# Patient Record
Sex: Female | Born: 1975 | Race: Black or African American | Hispanic: No | Marital: Single | Smoking: Current some day smoker
Health system: Southern US, Community
[De-identification: ages and names within clinical notes are randomized; demographics above are authoritative.]

## PROBLEM LIST (undated history)

## (undated) DIAGNOSIS — Z789 Other specified health status: Secondary | ICD-10-CM

---

## 2005-11-13 ENCOUNTER — Emergency Department (HOSPITAL_COMMUNITY): Admission: EM | Admit: 2005-11-13 | Discharge: 2005-11-14 | Payer: Self-pay | Admitting: Emergency Medicine

## 2006-10-22 ENCOUNTER — Emergency Department (HOSPITAL_COMMUNITY): Admission: EM | Admit: 2006-10-22 | Discharge: 2006-10-22 | Payer: Self-pay | Admitting: Emergency Medicine

## 2007-01-14 ENCOUNTER — Emergency Department (HOSPITAL_COMMUNITY): Admission: EM | Admit: 2007-01-14 | Discharge: 2007-01-14 | Payer: Self-pay | Admitting: Family Medicine

## 2008-09-13 ENCOUNTER — Emergency Department (HOSPITAL_COMMUNITY): Admission: EM | Admit: 2008-09-13 | Discharge: 2008-09-13 | Payer: Self-pay | Admitting: *Deleted

## 2008-09-15 ENCOUNTER — Inpatient Hospital Stay (HOSPITAL_COMMUNITY): Admission: AD | Admit: 2008-09-15 | Discharge: 2008-09-15 | Payer: Self-pay | Admitting: Obstetrics & Gynecology

## 2008-09-17 ENCOUNTER — Inpatient Hospital Stay (HOSPITAL_COMMUNITY): Admission: AD | Admit: 2008-09-17 | Discharge: 2008-09-17 | Payer: Self-pay | Admitting: Obstetrics & Gynecology

## 2008-09-24 ENCOUNTER — Inpatient Hospital Stay (HOSPITAL_COMMUNITY): Admission: AD | Admit: 2008-09-24 | Discharge: 2008-09-24 | Payer: Self-pay | Admitting: Obstetrics & Gynecology

## 2008-10-01 ENCOUNTER — Inpatient Hospital Stay (HOSPITAL_COMMUNITY): Admission: AD | Admit: 2008-10-01 | Discharge: 2008-10-01 | Payer: Self-pay | Admitting: Obstetrics & Gynecology

## 2008-10-11 ENCOUNTER — Emergency Department (HOSPITAL_COMMUNITY): Admission: EM | Admit: 2008-10-11 | Discharge: 2008-10-11 | Payer: Self-pay | Admitting: Emergency Medicine

## 2008-10-12 ENCOUNTER — Inpatient Hospital Stay (HOSPITAL_COMMUNITY): Admission: AD | Admit: 2008-10-12 | Discharge: 2008-10-12 | Payer: Self-pay | Admitting: Obstetrics & Gynecology

## 2008-11-08 ENCOUNTER — Inpatient Hospital Stay (HOSPITAL_COMMUNITY): Admission: AD | Admit: 2008-11-08 | Discharge: 2008-11-08 | Payer: Self-pay | Admitting: Obstetrics & Gynecology

## 2009-08-20 ENCOUNTER — Emergency Department (HOSPITAL_COMMUNITY): Admission: EM | Admit: 2009-08-20 | Discharge: 2009-08-21 | Payer: Self-pay | Admitting: Emergency Medicine

## 2010-12-18 LAB — WET PREP, GENITAL
Clue Cells Wet Prep HPF POC: NONE SEEN
WBC, Wet Prep HPF POC: NONE SEEN

## 2010-12-18 LAB — CBC
HCT: 37.8 % (ref 36.0–46.0)
Hemoglobin: 12.6 g/dL (ref 12.0–15.0)
MCHC: 33.5 g/dL (ref 30.0–36.0)
MCV: 88.7 fL (ref 78.0–100.0)
RBC: 4.26 MIL/uL (ref 3.87–5.11)

## 2010-12-18 LAB — POCT PREGNANCY, URINE: Preg Test, Ur: NEGATIVE

## 2010-12-18 LAB — URINALYSIS, ROUTINE W REFLEX MICROSCOPIC
Bilirubin Urine: NEGATIVE
Ketones, ur: NEGATIVE mg/dL
Nitrite: NEGATIVE
Protein, ur: NEGATIVE mg/dL
Urobilinogen, UA: 1 mg/dL (ref 0.0–1.0)

## 2010-12-18 LAB — GC/CHLAMYDIA PROBE AMP, GENITAL: GC Probe Amp, Genital: NEGATIVE

## 2010-12-18 LAB — URINE MICROSCOPIC-ADD ON

## 2010-12-27 ENCOUNTER — Emergency Department (HOSPITAL_COMMUNITY): Payer: Self-pay

## 2010-12-27 ENCOUNTER — Emergency Department (HOSPITAL_COMMUNITY)
Admission: EM | Admit: 2010-12-27 | Discharge: 2010-12-27 | Disposition: A | Discharge status: Home / Self Care | Payer: Self-pay | Attending: Emergency Medicine | Admitting: Emergency Medicine

## 2010-12-27 DIAGNOSIS — A499 Bacterial infection, unspecified: Secondary | ICD-10-CM | POA: Insufficient documentation

## 2010-12-27 DIAGNOSIS — R112 Nausea with vomiting, unspecified: Secondary | ICD-10-CM | POA: Insufficient documentation

## 2010-12-27 DIAGNOSIS — N390 Urinary tract infection, site not specified: Secondary | ICD-10-CM | POA: Insufficient documentation

## 2010-12-27 DIAGNOSIS — N760 Acute vaginitis: Secondary | ICD-10-CM | POA: Insufficient documentation

## 2010-12-27 DIAGNOSIS — B9689 Other specified bacterial agents as the cause of diseases classified elsewhere: Secondary | ICD-10-CM | POA: Insufficient documentation

## 2010-12-27 DIAGNOSIS — K5900 Constipation, unspecified: Secondary | ICD-10-CM | POA: Insufficient documentation

## 2010-12-27 DIAGNOSIS — M549 Dorsalgia, unspecified: Secondary | ICD-10-CM | POA: Insufficient documentation

## 2010-12-27 DIAGNOSIS — R509 Fever, unspecified: Secondary | ICD-10-CM | POA: Insufficient documentation

## 2010-12-27 DIAGNOSIS — R109 Unspecified abdominal pain: Secondary | ICD-10-CM | POA: Insufficient documentation

## 2010-12-27 DIAGNOSIS — R350 Frequency of micturition: Secondary | ICD-10-CM | POA: Insufficient documentation

## 2010-12-27 LAB — URINALYSIS, ROUTINE W REFLEX MICROSCOPIC
Protein, ur: 30 mg/dL — AB
Urobilinogen, UA: 0.2 mg/dL (ref 0.0–1.0)

## 2010-12-27 LAB — DIFFERENTIAL
Basophils Absolute: 0 10*3/uL (ref 0.0–0.1)
Eosinophils Absolute: 0.1 10*3/uL (ref 0.0–0.7)
Eosinophils Relative: 2 % (ref 0–5)
Lymphocytes Relative: 21 % (ref 12–46)
Neutrophils Relative %: 68 % (ref 43–77)

## 2010-12-27 LAB — POCT I-STAT, CHEM 8
Glucose, Bld: 88 mg/dL (ref 70–99)
HCT: 39 % (ref 36.0–46.0)
Hemoglobin: 13.3 g/dL (ref 12.0–15.0)
Potassium: 4.2 mEq/L (ref 3.5–5.1)

## 2010-12-27 LAB — CBC
HCT: 37.8 % (ref 36.0–46.0)
Platelets: 220 10*3/uL (ref 150–400)
RBC: 4.34 MIL/uL (ref 3.87–5.11)
RDW: 13 % (ref 11.5–15.5)
WBC: 6.6 10*3/uL (ref 4.0–10.5)

## 2010-12-27 LAB — URINE MICROSCOPIC-ADD ON

## 2010-12-27 LAB — WET PREP, GENITAL

## 2010-12-28 LAB — GC/CHLAMYDIA PROBE AMP, GENITAL
Chlamydia, DNA Probe: NEGATIVE
GC Probe Amp, Genital: NEGATIVE

## 2010-12-30 LAB — URINE CULTURE: Colony Count: >=100000

## 2010-12-31 LAB — POCT I-STAT, CHEM 8
Calcium, Ion: 1.19 mmol/L (ref 1.12–1.32)
Creatinine, Ser: 0.8 mg/dL (ref 0.4–1.2)
Glucose, Bld: 93 mg/dL (ref 70–99)
HCT: 39 % (ref 36.0–46.0)
Hemoglobin: 13.3 g/dL (ref 12.0–15.0)
Potassium: 4 mEq/L (ref 3.5–5.1)
TCO2: 23 mmol/L (ref 0–100)

## 2010-12-31 LAB — HCG, QUANTITATIVE, PREGNANCY
hCG, Beta Chain, Quant, S: 206 m[IU]/mL — ABNORMAL HIGH (ref <5)
hCG, Beta Chain, Quant, S: 60 m[IU]/mL — ABNORMAL HIGH (ref <5)
hCG, Beta Chain, Quant, S: 13 m[IU]/mL — ABNORMAL HIGH (ref <5)
hCG, Beta Chain, Quant, S: 34 m[IU]/mL — ABNORMAL HIGH (ref <5)

## 2011-06-21 LAB — URINALYSIS, ROUTINE W REFLEX MICROSCOPIC
Glucose, UA: NEGATIVE mg/dL
Ketones, ur: NEGATIVE mg/dL
Nitrite: NEGATIVE
Specific Gravity, Urine: 1.04 — ABNORMAL HIGH (ref 1.005–1.030)
pH: 6 (ref 5.0–8.0)

## 2011-06-21 LAB — COMPREHENSIVE METABOLIC PANEL
ALT: 13 U/L (ref 0–35)
AST: 16 U/L (ref 0–37)
Calcium: 9 mg/dL (ref 8.4–10.5)
Creatinine, Ser: 0.79 mg/dL (ref 0.4–1.2)
GFR calc Af Amer: >60 mL/min (ref >60)
Sodium: 138 mEq/L (ref 135–145)
Total Protein: 7.3 g/dL (ref 6.0–8.3)

## 2011-06-21 LAB — CBC
MCHC: 32.1 g/dL (ref 30.0–36.0)
MCV: 89 fL (ref 78.0–100.0)
Platelets: 242 10*3/uL (ref 150–400)
RDW: 13.4 % (ref 11.5–15.5)

## 2011-06-21 LAB — URINE MICROSCOPIC-ADD ON

## 2011-06-21 LAB — DIFFERENTIAL
Eosinophils Absolute: 0.2 10*3/uL (ref 0.0–0.7)
Eosinophils Relative: 4 % (ref 0–5)
Lymphocytes Relative: 25 % (ref 12–46)
Lymphs Abs: 1.3 10*3/uL (ref 0.7–4.0)
Monocytes Relative: 8 % (ref 3–12)
Neutrophils Relative %: 63 % (ref 43–77)

## 2011-06-21 LAB — LIPASE, BLOOD: Lipase: 19 U/L (ref 11–59)

## 2011-06-21 LAB — WET PREP, GENITAL: Clue Cells Wet Prep HPF POC: NONE SEEN

## 2011-06-21 LAB — TYPE AND SCREEN
ABO/RH(D): B POS
Antibody Screen: NEGATIVE

## 2011-06-21 LAB — HCG, QUANTITATIVE, PREGNANCY: hCG, Beta Chain, Quant, S: 577 m[IU]/mL — ABNORMAL HIGH (ref <5)

## 2011-06-21 LAB — GC/CHLAMYDIA PROBE AMP, GENITAL: GC Probe Amp, Genital: NEGATIVE

## 2015-08-14 ENCOUNTER — Inpatient Hospital Stay (HOSPITAL_COMMUNITY)
Admission: AD | Admit: 2015-08-14 | Discharge: 2015-08-14 | Disposition: A | Discharge status: Home / Self Care | Payer: Managed Care, Other (non HMO) | Source: Ambulatory Visit | Attending: Family Medicine | Admitting: Family Medicine

## 2015-08-14 ENCOUNTER — Encounter (HOSPITAL_COMMUNITY): Payer: Self-pay | Admitting: *Deleted

## 2015-08-14 DIAGNOSIS — D259 Leiomyoma of uterus, unspecified: Secondary | ICD-10-CM | POA: Insufficient documentation

## 2015-08-14 DIAGNOSIS — N939 Abnormal uterine and vaginal bleeding, unspecified: Secondary | ICD-10-CM | POA: Diagnosis not present

## 2015-08-14 DIAGNOSIS — N946 Dysmenorrhea, unspecified: Secondary | ICD-10-CM | POA: Insufficient documentation

## 2015-08-14 HISTORY — DX: Other specified health status: Z78.9

## 2015-08-14 LAB — CBC
HCT: 35.6 % — ABNORMAL LOW (ref 36.0–46.0)
HEMOGLOBIN: 11.7 g/dL — AB (ref 12.0–15.0)
MCH: 28.5 pg (ref 26.0–34.0)
MCHC: 32.9 g/dL (ref 30.0–36.0)
MCV: 86.6 fL (ref 78.0–100.0)
Platelets: 266 10*3/uL (ref 150–400)
RBC: 4.11 MIL/uL (ref 3.87–5.11)
RDW: 13.9 % (ref 11.5–15.5)
WBC: 3.5 10*3/uL — AB (ref 4.0–10.5)

## 2015-08-14 LAB — URINALYSIS, ROUTINE W REFLEX MICROSCOPIC
BILIRUBIN URINE: NEGATIVE
Glucose, UA: NEGATIVE mg/dL
KETONES UR: NEGATIVE mg/dL
Leukocytes, UA: NEGATIVE
Nitrite: NEGATIVE
Protein, ur: NEGATIVE mg/dL
SPECIFIC GRAVITY, URINE: 1.025 (ref 1.005–1.030)
pH: 6 (ref 5.0–8.0)

## 2015-08-14 LAB — WET PREP, GENITAL
CLUE CELLS WET PREP: NONE SEEN
Sperm: NONE SEEN
TRICH WET PREP: NONE SEEN
YEAST WET PREP: NONE SEEN

## 2015-08-14 LAB — URINE MICROSCOPIC-ADD ON
SPERM UA: NONE SEEN
Trichomonas, UA: NONE SEEN
WBC, UA: NONE SEEN WBC/hpf (ref 0–5)

## 2015-08-14 LAB — POCT PREGNANCY, URINE: PREG TEST UR: NEGATIVE

## 2015-08-14 MED ORDER — MEGESTROL ACETATE 20 MG PO TABS
40.0000 mg | ORAL_TABLET | Freq: Two times a day (BID) | ORAL | Status: DC
Start: 2015-08-14 — End: 2015-09-04

## 2015-08-14 NOTE — Discharge Instructions (Signed)
Abnormal Uterine Bleeding Abnormal uterine bleeding means bleeding from the vagina that is not your normal menstrual period. This can be:  Bleeding or spotting between periods.  Bleeding after sex (sexual intercourse).  Bleeding that is heavier or more than normal.  Periods that last longer than usual.  Bleeding after menopause. There are many problems that may cause this. Treatment will depend on the cause of the bleeding. Any kind of bleeding that is not normal should be reviewed by your doctor.  HOME CARE Watch your condition for any changes. These actions may lessen any discomfort you are having:  Do not use tampons or douches as told by your doctor.  Change your pads often. You should get regular pelvic exams and Pap tests. Keep all appointments for tests as told by your doctor. GET HELP IF:  You are bleeding for more than 1 week.  You feel dizzy at times. GET HELP RIGHT AWAY IF:   You pass out.  You have to change pads every 15 to 30 minutes.  You have belly pain.  You have a fever.  You become sweaty or weak.  You are passing large blood clots from the vagina.  You feel sick to your stomach (nauseous) and throw up (vomit). MAKE SURE YOU:  Understand these instructions.  Will watch your condition.  Will get help right away if you are not doing well or get worse.   This information is not intended to replace advice given to you by your health care provider. Make sure you discuss any questions you have with your health care provider.   Document Released: 06/30/2009 Document Revised: 09/07/2013 Document Reviewed: 04/01/2013 Elsevier Interactive Patient Education 2016 Elsevier Inc. Uterine Fibroids Uterine fibroids are tissue masses (tumors). They are also called leiomyomas. They can develop inside of a woman's womb (uterus). They can grow very large. Fibroids are not cancerous (benign). Most fibroids do not require medical treatment. HOME CARE  Keep all  follow-up visits as told by your doctor. This is important.  Take medicines only as told by your doctor.  If you were prescribed a hormone treatment, take the hormone medicines exactly as told.  Do not take aspirin. It can cause bleeding.  Ask your doctor about taking iron pills and increasing the amount of dark green, leafy vegetables in your diet. These actions can help to boost your blood iron levels.  Pay close attention to your period. Tell your doctor about any changes, such as:  Increased blood flow. This may require you to use more pads or tampons than usual per month.  A change in the number of days that your period lasts per month.  A change in symptoms that come with your period, such as back pain or cramping in your belly area (abdomen). GET HELP IF:  You have pain in your back or the area between your hip bones (pelvic area) that is not controlled by medicines.  You have pain in your abdomen that is not controlled with medicines.  You have an increase in bleeding between and during periods.  You soak tampons or pads in a half hour or less.  You feel lightheaded.  You feel extra tired.  You feel weak. GET HELP RIGHT AWAY IF:   You pass out (faint).  You have a sudden increase in pelvic pain.   This information is not intended to replace advice given to you by your health care provider. Make sure you discuss any questions you have with your health  care provider.   Document Released: 10/05/2010 Document Revised: 09/23/2014 Document Reviewed: 03/01/2014 Elsevier Interactive Patient Education [employer].

## 2015-08-14 NOTE — MAU Provider Note (Signed)
History     CSN: [id number]  Arrival date and time: 08/14/15 [id number]   First Provider Initiated Contact with Patient 08/14/15 [id number]      Chief Complaint  Patient presents with  . Vaginal Bleeding  . Abdominal Pain   HPI  [name] is a 39 y.o. [id number] who presents to MAU today with complaint of vaginal bleeding. She states a history of irregular periods. She was diagnosed with fibroids ~ 2 years ago and has not seen a GYN since then. She states that periods are often prolonged, heavy and painful. She rates pain at 4/10 now. She states that Motrin helps relieve her pain. She denies weakness or dizziness. Her bleeding with this episode started on 07/28/15.   OB History    Gravida Para Term Preterm AB TAB SAB Ectopic Multiple Living   2    1  1   1       Past Medical History  Diagnosis Date  . Medical history non-contributory     Past Surgical History  Procedure Laterality Date  . No past surgeries      History reviewed. No pertinent family history.  Social History  Substance Use Topics  . Smoking status: Current Some Day Smoker -- 0.25 packs/day  . Smokeless tobacco: None  . Alcohol Use: No    Allergies: No Known Allergies  Prescriptions prior to admission  Medication Sig Dispense Refill Last Dose  . ibuprofen (ADVIL,MOTRIN) 200 MG tablet Take 200 mg by mouth every 6 (six) hours as needed for mild pain.   08/14/2015 at Unknown time    Review of Systems  Constitutional: Negative for fever and malaise/fatigue.  Gastrointestinal: Positive for abdominal pain. Negative for nausea, vomiting, diarrhea and constipation.  Genitourinary: Negative for dysuria, urgency and frequency.       + vaginal bleeding Neg - vaginal discharge   Physical Exam   Blood pressure 114/77, pulse 76, temperature 97.3 F (36.3 C), temperature source Oral, resp. rate 16, last menstrual period 07/28/2015.  Physical Exam  Nursing note and vitals reviewed. Constitutional: She is  oriented to person, place, and time. She appears well-developed and well-nourished. No distress.  HENT:  Head: Normocephalic and atraumatic.  Cardiovascular: Normal rate.   Respiratory: Effort normal.  GI: Soft. She exhibits no distension and no mass. There is no tenderness. There is no rebound and no guarding.  Genitourinary: Uterus is enlarged. Uterus is not tender. Cervix exhibits no motion tenderness, no discharge and no friability. Right adnexum displays no mass and no tenderness. Left adnexum displays no mass and no tenderness. There is bleeding (small amount of blood noted on exam) in the vagina. No vaginal discharge found.  Neurological: She is alert and oriented to person, place, and time.  Skin: Skin is warm and dry. No erythema.  Psychiatric: She has a normal mood and affect.    Results for orders placed or performed during the hospital encounter of 08/14/15 (from the past 24 hour(s))  Urinalysis, Routine w reflex microscopic (not at [facility])     Status: Abnormal   Collection Time: 08/14/15  9:35 AM  Result Value Ref Range   Color, Urine YELLOW YELLOW   APPearance HAZY (A) CLEAR   Specific Gravity, Urine 1.025 1.005 - 1.030   pH 6.0 5.0 - 8.0   Glucose, UA NEGATIVE NEGATIVE mg/dL   Hgb urine dipstick LARGE (A) NEGATIVE   Bilirubin Urine NEGATIVE NEGATIVE   Ketones, ur NEGATIVE NEGATIVE mg/dL   Protein, ur NEGATIVE  NEGATIVE mg/dL   Nitrite NEGATIVE NEGATIVE   Leukocytes, UA NEGATIVE NEGATIVE  Urine microscopic-add on     Status: Abnormal   Collection Time: 08/14/15  9:35 AM  Result Value Ref Range   Squamous Epithelial / LPF 0-5 (A) NONE SEEN   WBC, UA NONE SEEN 0 - 5 WBC/hpf   RBC / HPF 0-5 0 - 5 RBC/hpf   Bacteria, UA FEW (A) NONE SEEN   Sperm, UA NONE SEEN    Trichomonas, UA NONE SEEN   Pregnancy, urine POC     Status: None   Collection Time: 08/14/15  9:55 AM  Result Value Ref Range   Preg Test, Ur NEGATIVE NEGATIVE  Wet prep, genital     Status: Abnormal    Collection Time: 08/14/15 10:35 AM  Result Value Ref Range   Yeast Wet Prep HPF POC NONE SEEN NONE SEEN   Trich, Wet Prep NONE SEEN NONE SEEN   Clue Cells Wet Prep HPF POC NONE SEEN NONE SEEN   WBC, Wet Prep HPF POC FEW (A) NONE SEEN   Sperm NONE SEEN   CBC     Status: Abnormal   Collection Time: 08/14/15 10:49 AM  Result Value Ref Range   WBC 3.5 (L) 4.0 - 10.5 K/uL   RBC 4.11 3.87 - 5.11 MIL/uL   Hemoglobin 11.7 (L) 12.0 - 15.0 g/dL   HCT 35.6 (L) 36.0 - 46.0 %   MCV 86.6 78.0 - 100.0 fL   MCH 28.5 26.0 - 34.0 pg   MCHC 32.9 30.0 - 36.0 g/dL   RDW 13.9 11.5 - 15.5 %   Platelets 266 150 - 400 K/uL    MAU Course  Procedures None  MDM UPT - negative UA, wet prep, GC/Chlamydia, RPR and HIV today Patient is hemodynamically stable today. Will continue further work-up for AUB as outpatient.  Assessment and Plan  A: AUB Fibroids Dysmenorrhea  P: Discharge home Rx for Megace sent to patient's pharmacy Continue Ibuprofen PRN for pain Bleeding precautions discussed Outpatient [place] scheduled for 08/18/15 at 1:00 pm Patient advised to follow-up with [facility] for further evaluation and management. They will call with an appointment date/time Patient may return to MAU as needed or if her condition were to change or worsen  Luvenia Redden, PA-C  08/14/2015, 11:17 AM

## 2015-08-14 NOTE — MAU Note (Signed)
Pt C/O vag bleeding since 11/11, also having abd pain, mostly RLQ.    Pt dx'd with fibroids 2 years ago, painful periods.

## 2015-08-15 LAB — GC/CHLAMYDIA PROBE AMP (~~LOC~~) NOT AT ARMC
Chlamydia: NEGATIVE
Neisseria Gonorrhea: NEGATIVE

## 2015-08-15 LAB — RPR: RPR Ser Ql: NONREACTIVE

## 2015-08-15 LAB — HIV ANTIBODY (ROUTINE TESTING W REFLEX): HIV Screen 4th Generation wRfx: NONREACTIVE

## 2015-08-18 ENCOUNTER — Ambulatory Visit (HOSPITAL_COMMUNITY)
Admission: RE | Admit: 2015-08-18 | Discharge: 2015-08-18 | Disposition: A | Discharge status: Home / Self Care | Payer: Managed Care, Other (non HMO) | Source: Ambulatory Visit | Attending: Medical | Admitting: Medical

## 2015-08-18 DIAGNOSIS — N939 Abnormal uterine and vaginal bleeding, unspecified: Secondary | ICD-10-CM

## 2015-09-04 ENCOUNTER — Encounter: Payer: Self-pay | Admitting: Obstetrics & Gynecology

## 2015-09-04 ENCOUNTER — Ambulatory Visit (INDEPENDENT_AMBULATORY_CARE_PROVIDER_SITE_OTHER): Payer: Managed Care, Other (non HMO) | Admitting: Obstetrics & Gynecology

## 2015-09-04 VITALS — BP 112/63 | HR 75 | Temp 98.9°F | Ht 64.0 in | Wt 144.0 lb

## 2015-09-04 DIAGNOSIS — D219 Benign neoplasm of connective and other soft tissue, unspecified: Secondary | ICD-10-CM | POA: Insufficient documentation

## 2015-09-04 DIAGNOSIS — D259 Leiomyoma of uterus, unspecified: Secondary | ICD-10-CM | POA: Diagnosis not present

## 2015-09-04 DIAGNOSIS — N939 Abnormal uterine and vaginal bleeding, unspecified: Secondary | ICD-10-CM | POA: Diagnosis not present

## 2015-09-04 LAB — TSH: TSH: 1.726 u[IU]/mL (ref 0.350–4.500)

## 2015-09-04 MED ORDER — MEGESTROL ACETATE 40 MG PO TABS: 40.0000 mg | ORAL_TABLET | Freq: Two times a day (BID) | ORAL | Status: DC

## 2015-09-04 MED ORDER — IBUPROFEN 800 MG PO TABS: 800.0000 mg | ORAL_TABLET | Freq: Three times a day (TID) | ORAL | Status: DC | PRN

## 2015-09-04 NOTE — Progress Notes (Signed)
CLINIC ENCOUNTER NOTE  History:  39 y.o. [id number] here today for follow up for fibroids and AUB. AUB controlled on Megace, taking Ibuprofen 200 mg po bid for pain which does not help much.  Patient desires surgical management of her fibroids.  She denies any abnormal vaginal discharge, bleeding or other concerns.   Past Medical History  Diagnosis Date  . Medical history non-contributory     Past Surgical History  Procedure Laterality Date  . No past surgeries      The following portions of the patient's history were reviewed and updated as appropriate: allergies, current medications, past family history, past medical history, past social history, past surgical history and problem list.   Health Maintenance:  Normal pap three years ago in [place].   Review of Systems:  Pertinent items noted in HPI and remainder of comprehensive ROS otherwise negative.  Objective:  Physical Exam BP 112/63 mmHg  Pulse 75  Temp(Src) 98.9 F (37.2 C) (Oral)  Ht 5\' 4"  (1.626 m)  Wt 144 lb (65.318 kg)  BMI 24.71 kg/m2  LMP 08/27/2015 CONSTITUTIONAL: Well-developed, well-nourished female in no acute distress.  HENT:  Normocephalic, atraumatic. External right and left ear normal. Oropharynx is clear and moist EYES: Conjunctivae and EOM are normal. Pupils are equal, round, and reactive to light. No scleral icterus.  NECK: Normal range of motion, supple, no masses SKIN: Skin is warm and dry. No rash noted. Not diaphoretic. No erythema. No pallor. [place]: Alert and oriented to person, place, and time. Normal reflexes, muscle tone coordination. No cranial nerve deficit noted. PSYCHIATRIC: Normal mood and affect. Normal behavior. Normal judgment and thought content. CARDIOVASCULAR: Normal heart rate noted RESPIRATORY: Effort and breath sounds normal, no problems with respiration noted ABDOMEN: Soft, no distention noted.  16 week size uterus palpated. PELVIC: Deferred MUSCULOSKELETAL: Normal range of  motion. No edema noted.  Labs and Imaging US Transvaginal Non-ob  08/18/2015  CLINICAL DATA:  Abnormal uterine bleeding. Bleeding with pelvic pain right greater than left since 07/28/2015. Gravida 2 para 1 SAB 1. LMP 07/28/2015. EXAM: TRANSABDOMINAL AND TRANSVAGINAL ULTRASOUND OF PELVIS TECHNIQUE: Both transabdominal and transvaginal ultrasound examinations of the pelvis were performed. Transabdominal technique was performed for global imaging of the pelvis including uterus, ovaries, adnexal regions, and pelvic cul-de-sac. It was necessary to proceed with endovaginal exam following the transabdominal exam to visualize the endometrium and ovaries. COMPARISON:  MRI 05/27/2012 FINDINGS: Uterus Measurements: At least 13.5 x 6.9 x 10.6 cm. Uterus is enlarged by multiple uterine fibroids are present. Left fundal fibroid is 5.3 x 4.8 x 5.8 cm. Fundal fibroid is 2.1 x 2.2 x 2.3 cm. Right fundal fibroid is 3.0 x 2.8 x 3.2 cm. Central fibroid is 1.7 x 1.3 x 2.0 cm. Central fibroid is 1.9 x 1.9 x 2.1 cm. Central posterior fibroid is 2.3 x 1.7 x 2.2 cm. Possible submucosal component associated with this fibroid. Endometrium Thickness: 9.2 mm. Difficult to visualize given the presence of multiple fibroids. The contour of the endometrium is distorted by fibroids. Right ovary Measurements: 3.7 x 2.4 x 4.3 cm. Normal appearance/no adnexal mass. Left ovary Measurements: 5.1 x 2.0 x 5.0 cm. Para ovarian cyst is 3.0 cm. Other findings Trace free pelvic fluid. IMPRESSION: 1. Multiple uterine fibroids may account for the patient's abnormal uterine bleeding. 2. The endometrial stripe is normal in thickness. If bleeding remains unresponsive to hormonal or medical therapy, sonohysterogram should be considered for focal lesion work-up. (Ref: Radiological Reasoning: Algorithmic Workup of Abnormal Vaginal  Bleeding with Endovaginal Sonography and Sonohysterography. AJR 2008; [id number]) 3. Normal appearance of the ovaries. Note is made of  left paraovarian cyst. Electronically Signed   By: Nolon Nations M.D.   On: 08/18/2015 14:19   US Pelvis Complete  08/18/2015  CLINICAL DATA:  Abnormal uterine bleeding. Bleeding with pelvic pain right greater than left since 07/28/2015. Gravida 2 para 1 SAB 1. LMP 07/28/2015. EXAM: TRANSABDOMINAL AND TRANSVAGINAL ULTRASOUND OF PELVIS TECHNIQUE: Both transabdominal and transvaginal ultrasound examinations of the pelvis were performed. Transabdominal technique was performed for global imaging of the pelvis including uterus, ovaries, adnexal regions, and pelvic cul-de-sac. It was necessary to proceed with endovaginal exam following the transabdominal exam to visualize the endometrium and ovaries. COMPARISON:  MRI 05/27/2012 FINDINGS: Uterus Measurements: At least 13.5 x 6.9 x 10.6 cm. Uterus is enlarged by multiple uterine fibroids are present. Left fundal fibroid is 5.3 x 4.8 x 5.8 cm. Fundal fibroid is 2.1 x 2.2 x 2.3 cm. Right fundal fibroid is 3.0 x 2.8 x 3.2 cm. Central fibroid is 1.7 x 1.3 x 2.0 cm. Central fibroid is 1.9 x 1.9 x 2.1 cm. Central posterior fibroid is 2.3 x 1.7 x 2.2 cm. Possible submucosal component associated with this fibroid. Endometrium Thickness: 9.2 mm. Difficult to visualize given the presence of multiple fibroids. The contour of the endometrium is distorted by fibroids. Right ovary Measurements: 3.7 x 2.4 x 4.3 cm. Normal appearance/no adnexal mass. Left ovary Measurements: 5.1 x 2.0 x 5.0 cm. Para ovarian cyst is 3.0 cm. Other findings Trace free pelvic fluid. IMPRESSION: 1. Multiple uterine fibroids may account for the patient's abnormal uterine bleeding. 2. The endometrial stripe is normal in thickness. If bleeding remains unresponsive to hormonal or medical therapy, sonohysterogram should be considered for focal lesion work-up. (Ref: Radiological Reasoning: Algorithmic Workup of Abnormal Vaginal Bleeding with Endovaginal Sonography and Sonohysterography. AJR 2008; [id number]) 3.  Normal appearance of the ovaries. Note is made of left paraovarian cyst. Electronically Signed   By: Nolon Nations M.D.   On: 08/18/2015 14:19    Assessment & Plan:  1. Uterine leiomyoma, unspecified location 2. Abnormal uterine bleeding (AUB) Discussed management options for abnormal uterine bleeding including tranexamic acid (Lysteda), oral progesterone (Megace), Depo Provera, Mirena IUD, endometrial ablation (Novasure/Hydrothermal Ablation), myomectomy or hysterectomy as definitive surgical management.  Discussed risks and benefits of each method.  Patient declines any medical/hormomal management or Lupron therapy, also declines any other surgery but hysterectomy.    Patient desires definitive management with hysterectomy.  I proposed doing a total abdominal hysterectomy (TAH) and prophylactic bilateral salpingectomy.  No indication for oophorectomy.  Patient agrees with this proposed surgery.  The risks of surgery were discussed in detail with the patient including but not limited to: bleeding which may require transfusion or reoperation; infection which may require antibiotics; injury to bowel, bladder, ureters or other surrounding organs; need for additional procedures including laparotomy; thromboembolic phenomenon, incisional problems and other postoperative/anesthesia complications.  Patient was also advised that she will remain in house for 1-2 nights; and expected recovery time after a hysterectomy is 6-8 weeks.  Likelihood of success in alleviating the patient's symptoms was discussed.   She was told that she will be contacted by our surgical scheduler regarding the time and date of her surgery; routine preoperative instructions of having nothing to eat or drink after midnight on the day prior to surgery and also coming to the hospital 1.5 hours prior to her time of surgery were also emphasized.  She was told she may be called for a preoperative appointment about a week prior to surgery and  will be given further preoperative instructions at that visit.  Routine postoperative instructions will be reviewed with the patient and her family in detail after surgery.  In the meantime, she will continue Megace and Ibuprofen (800 mg tid prescribed); bleeding precautions were reviewed. Printed patient education handouts about the procedure was given to the patient to review at home.  Patient will return for preoperative pap smear and endometrial biopsy.  Orders placed: - megestrol (MEGACE) 40 MG tablet; Take 1 tablet (40 mg total) by mouth 2 (two) times daily. Can increase to two tablets twice a day for heavy bleeding  Dispense: 60 tablet; Refill: 8 - ibuprofen (ADVIL,MOTRIN) 800 MG tablet; Take 1 tablet (800 mg total) by mouth 3 (three) times daily as needed for moderate pain or cramping.  Dispense: 30 tablet; Refill: 3 - TSH - Surgical orders placed  Routine preventative health maintenance measures emphasized. Please refer to After Visit Summary for other counseling recommendations.   Return in about 1 month (around 10/05/2015) for Pap smear and endometrial biopsy with Elanore Talcott.   Total face-to-face time with patient: 25 minutes. Over 50% of encounter was spent on counseling and coordination of care.   Verita Schneiders, MD, [place] Attending Obstetrician & Gynecologist, [place] for [facility]

## 2015-09-04 NOTE — Patient Instructions (Signed)
Hysterectomy Information  A hysterectomy is a surgery in which your uterus is removed. This surgery may be done to treat various medical problems. After the surgery, you will no longer have menstrual periods. The surgery will also make you unable to become pregnant (sterile). The fallopian tubes and ovaries can be removed (bilateral salpingo-oophorectomy) during this surgery as well.  REASONS FOR A HYSTERECTOMY  Persistent, abnormal bleeding.  Lasting (chronic) pelvic pain or infection.  The lining of the uterus (endometrium) starts growing outside the uterus (endometriosis).  The endometrium starts growing in the muscle of the uterus (adenomyosis).  The uterus falls down into the vagina (pelvic organ prolapse).  Noncancerous growths in the uterus (uterine fibroids) that cause symptoms.  Precancerous cells.  Cervical cancer or uterine cancer. TYPES OF HYSTERECTOMIES  Supracervical hysterectomy--In this type, the top part of the uterus is removed, but not the cervix.  Total hysterectomy--The uterus and cervix are removed.  Radical hysterectomy--The uterus, the cervix, and the fibrous tissue that holds the uterus in place in the pelvis (parametrium) are removed. WAYS A HYSTERECTOMY CAN BE PERFORMED  Abdominal hysterectomy--A large surgical cut (incision) is made in the abdomen. The uterus is removed through this incision.  Vaginal hysterectomy--An incision is made in the vagina. The uterus is removed through this incision. There are no abdominal incisions.  Conventional laparoscopic hysterectomy--Three or four small incisions are made in the abdomen. A thin, lighted tube with a camera (laparoscope) is inserted into one of the incisions. Other tools are put through the other incisions. The uterus is cut into small pieces. The small pieces are removed through the incisions, or they are removed through the vagina.  Laparoscopically assisted vaginal hysterectomy (LAVH)--Three or four  small incisions are made in the abdomen. Part of the surgery is performed laparoscopically and part vaginally. The uterus is removed through the vagina.  Robot-assisted laparoscopic hysterectomy--A laparoscope and other tools are inserted into 3 or 4 small incisions in the abdomen. A computer-controlled device is used to give the surgeon a 3D image and to help control the surgical instruments. This allows for more precise movements of surgical instruments. The uterus is cut into small pieces and removed through the incisions or removed through the vagina. RISKS AND COMPLICATIONS  Possible complications associated with this procedure include:  Bleeding and risk of blood transfusion. Tell your health care provider if you do not want to receive any blood products.  Blood clots in the legs or lung.  Infection.  Injury to surrounding organs.  Problems or side effects related to anesthesia.  Conversion to an abdominal hysterectomy from one of the other techniques. WHAT TO EXPECT AFTER A HYSTERECTOMY  You will be given pain medicine.  You will need to have someone with you for the first 3-5 days after you go home.  You will need to follow up with your surgeon in 2-4 weeks after surgery to evaluate your progress.  You may have early menopause symptoms such as hot flashes, night sweats, and insomnia.  If you had a hysterectomy for a problem that was not cancer or not a condition that could lead to cancer, then you no longer need Pap tests. However, even if you no longer need a Pap test, a regular exam is a good idea to make sure no other problems are starting.   This information is not intended to replace advice given to you by your health care provider. Make sure you discuss any questions you have with your health care   provider.   Document Released: 02/26/2001 Document Revised: 06/23/2013 Document Reviewed: 05/10/2013 Elsevier Interactive Patient Education 2016 [place].  Abdominal  Hysterectomy Abdominal hysterectomy is a surgical procedure to remove your womb (uterus). Your uterus is the muscular organ that contains a developing baby. This surgery is done for many reasons. You may need an abdominal hysterectomy if you have cancer, growths (tumors), long-term pain, or bleeding. You may also have this procedure if your uterus has slipped down into your vagina (uterine prolapse). Depending on why you need an abdominal hysterectomy, you may also have other reproductive organs removed. These could include the part of your vagina that connects with your uterus (cervix), the organs that make eggs (ovaries), and the tubes that connect the ovaries to the uterus (fallopian tubes). LET [facility] CARE PROVIDER KNOW ABOUT:   Any allergies you have.  All medicines you are taking, including vitamins, herbs, eye drops, creams, and over-the-counter medicines.  Previous problems you or members of your family have had with the use of anesthetics.  Any blood disorders you have.  Previous surgeries you have had.  Medical conditions you have. RISKS AND COMPLICATIONS Generally, this is a safe procedure. However, as with any procedure, problems can occur. Infection is the most common problem after an abdominal hysterectomy. Other possible problems include:  Bleeding.  Formation of blood clots that may break free and travel to your lungs.  Injury to other organs near your uterus.  Nerve injury causing nerve pain.  Decreased interest in sex or pain during sexual intercourse. BEFORE THE PROCEDURE  Abdominal hysterectomy is a major surgical procedure. It can affect the way you feel about yourself. Talk to your health care provider about the physical and emotional changes hysterectomy may cause.  You may need to have blood work and X-rays done before surgery.  Quit smoking if you smoke. Ask your health care provider for help if you are struggling to quit.  Stop taking medicines that  thin your blood as directed by your health care provider.  You may be instructed to take antibiotic medicines or laxatives before surgery.  Do not eat or drink anything for 6-8 hours before surgery.  Take your regular medicines with a small sip of water.  Bathe or shower the night or morning before surgery. PROCEDURE  Abdominal hysterectomy is done in the operating room at the hospital.  In most cases, you will be given a medicine that makes you go to sleep (general anesthetic).  The surgeon will make a cut (incision) through the skin in your lower belly.  The incision may be about 5-7 inches long. It may go side-to-side or up-and-down.  The surgeon will move aside the body tissue that covers your uterus. The surgeon will then carefully take out your uterus along with any of your other reproductive organs that need to be removed.  Bleeding will be controlled with clamps or sutures.  The surgeon will close your incision with sutures or metal clips. AFTER THE PROCEDURE  You will have some pain immediately after the procedure.  You will be given pain medicine in the recovery room.  You will be taken to your hospital room when you have recovered from the anesthesia.  You may need to stay in the hospital for 2-5 days.  You will be given instructions for recovery at home.   This information is not intended to replace advice given to you by your health care provider. Make sure you discuss any questions you have with  your health care provider.   Document Released: 09/07/2013 Document Reviewed: 09/07/2013 Elsevier Interactive Patient Education [employer].

## 2015-09-05 ENCOUNTER — Encounter (HOSPITAL_COMMUNITY): Payer: Self-pay | Admitting: *Deleted

## 2015-09-29 ENCOUNTER — Telehealth: Payer: Self-pay

## 2015-09-29 ENCOUNTER — Encounter: Payer: Self-pay | Admitting: Obstetrics and Gynecology

## 2015-09-29 ENCOUNTER — Ambulatory Visit (INDEPENDENT_AMBULATORY_CARE_PROVIDER_SITE_OTHER): Payer: Managed Care, Other (non HMO) | Admitting: Obstetrics and Gynecology

## 2015-09-29 ENCOUNTER — Other Ambulatory Visit (HOSPITAL_COMMUNITY)
Admission: RE | Admit: 2015-09-29 | Discharge: 2015-09-29 | Disposition: A | Discharge status: Home / Self Care | Payer: Managed Care, Other (non HMO) | Source: Ambulatory Visit | Attending: Obstetrics and Gynecology | Admitting: Obstetrics and Gynecology

## 2015-09-29 VITALS — BP 132/87 | HR 84 | Temp 98.0°F | Resp 18 | Ht 65.0 in | Wt 147.6 lb

## 2015-09-29 DIAGNOSIS — Z124 Encounter for screening for malignant neoplasm of cervix: Secondary | ICD-10-CM

## 2015-09-29 DIAGNOSIS — R87619 Unspecified abnormal cytological findings in specimens from cervix uteri: Secondary | ICD-10-CM | POA: Diagnosis present

## 2015-09-29 DIAGNOSIS — Z1239 Encounter for other screening for malignant neoplasm of breast: Secondary | ICD-10-CM

## 2015-09-29 DIAGNOSIS — Z1151 Encounter for screening for human papillomavirus (HPV): Secondary | ICD-10-CM

## 2015-09-29 DIAGNOSIS — N939 Abnormal uterine and vaginal bleeding, unspecified: Secondary | ICD-10-CM

## 2015-09-29 DIAGNOSIS — D259 Leiomyoma of uterus, unspecified: Secondary | ICD-10-CM | POA: Diagnosis not present

## 2015-09-29 NOTE — Progress Notes (Signed)
Patient ID: Selena Smith, female   DOB: 06-20-1976, 40 y.o.   MRN: [id number] 40 yo [id number] scheduled for TAH secondary to fibroid uterus and DUB who is here for endometrial biopsy and pap smear.   Blood pressure 132/87, pulse 84, temperature 98 F (36.7 C), temperature source Oral, resp. rate 18, height 5\' 5"  (1.651 m), weight 147 lb 9.6 oz (66.951 kg), last menstrual period 08/27/2015, SpO2 100 %.  GENERAL: Well-developed, well-nourished female in no acute distress.  ABDOMEN: Soft, nontender, nondistended. No organomegaly. PELVIC: Normal external female genitalia. Vagina is pink and rugated.  Normal discharge. Normal appearing cervix. Uterus is 16-week in size. No adnexal mass or tenderness. EXTREMITIES: No cyanosis, clubbing, or edema, 2+ distal pulses.  A/P 40 yo here for endometrial biopsy and pap smear in preparation for surgery - pap smear collected - ENDOMETRIAL BIOPSY     The indications for endometrial biopsy were reviewed.   Risks of the biopsy including cramping, bleeding, infection, uterine perforation, inadequate specimen and need for additional procedures  were discussed. The patient states she understands and agrees to undergo procedure today. Consent was signed. Time out was performed. Urine HCG was negative. A sterile speculum was placed in the patient's vagina and the cervix was prepped with Betadine. A single-toothed tenaculum was placed on the anterior lip of the cervix to stabilize it. The uterine cavity was sounded to a depth of 8 cm using the uterine sound. The 3 mm pipelle was introduced into the endometrial cavity without difficulty, 2 passes were made.  A  small amount of tissue was  sent to pathology. It was very difficult to thread the pipelle in the endometrial cavity, likely secondary to the presence of fibroids.  The instruments were removed from the patient's vagina. Minimal bleeding from the cervix was noted. The patient tolerated the procedure well.  Routine  post-procedure instructions were given to the patient. The patient will follow up in two weeks to review the results and for further management.  - Patient will be notified of the results - Screening mammogram also scheduled

## 2015-09-29 NOTE — Addendum Note (Signed)
Addended by: Mason Jim A on: 09/29/2015 12:00 PM   Modules accepted: Orders

## 2015-09-29 NOTE — Telephone Encounter (Signed)
Called pt to inform her of her mammogram appt on Wed Jan 5th @ 1110. Pt verbalizes understanding.

## 2015-10-02 ENCOUNTER — Encounter (HOSPITAL_COMMUNITY): Payer: Self-pay

## 2015-10-02 ENCOUNTER — Encounter (HOSPITAL_COMMUNITY)
Admission: RE | Admit: 2015-10-02 | Discharge: 2015-10-02 | Disposition: A | Discharge status: Home / Self Care | Payer: Managed Care, Other (non HMO) | Source: Ambulatory Visit | Attending: Obstetrics & Gynecology | Admitting: Obstetrics & Gynecology

## 2015-10-02 DIAGNOSIS — Z01812 Encounter for preprocedural laboratory examination: Secondary | ICD-10-CM | POA: Diagnosis present

## 2015-10-02 DIAGNOSIS — N939 Abnormal uterine and vaginal bleeding, unspecified: Secondary | ICD-10-CM | POA: Insufficient documentation

## 2015-10-02 DIAGNOSIS — D259 Leiomyoma of uterus, unspecified: Secondary | ICD-10-CM | POA: Insufficient documentation

## 2015-10-02 LAB — CBC
HCT: 36.2 % (ref 36.0–46.0)
HEMOGLOBIN: 11.8 g/dL — AB (ref 12.0–15.0)
MCH: 28.4 pg (ref 26.0–34.0)
MCHC: 32.6 g/dL (ref 30.0–36.0)
MCV: 87.2 fL (ref 78.0–100.0)
Platelets: 271 10*3/uL (ref 150–400)
RBC: 4.15 MIL/uL (ref 3.87–5.11)
RDW: 14.3 % (ref 11.5–15.5)
WBC: 4.2 10*3/uL (ref 4.0–10.5)

## 2015-10-02 LAB — TYPE AND SCREEN
ABO/RH(D): B POS
Antibody Screen: NEGATIVE

## 2015-10-02 NOTE — Patient Instructions (Signed)
Your procedure is scheduled on: 10/17/15  Enter through the Main Entrance at : 1:30pm Pick up desk phone and dial [phone] and inform us of your arrival.  Please call [phone] if you have any problems the morning of surgery.  Remember: Do not eat food after midnight:Monday Clear liquids are ok until:11am on Tuesday   You may brush your teeth the morning of surgery.  Take these meds the morning of surgery with a sip of water:none  DO NOT wear jewelry, eye make-up, lipstick,body lotion, or dark fingernail polish.  (Polished toes are ok) You may wear deodorant.  If you are to be admitted after surgery, leave suitcase in car until your room has been assigned. Patients discharged on the day of surgery will not be allowed to drive home. Wear loose fitting, comfortable clothes for your ride home.

## 2015-10-03 LAB — ABO/RH: ABO/RH(D): B POS

## 2015-10-03 LAB — CYTOLOGY - PAP

## 2015-10-06 NOTE — Telephone Encounter (Signed)
Error

## 2015-10-11 ENCOUNTER — Telehealth: Payer: Self-pay | Admitting: General Practice

## 2015-10-11 ENCOUNTER — Ambulatory Visit
Admission: RE | Admit: 2015-10-11 | Discharge: 2015-10-11 | Disposition: A | Discharge status: Home / Self Care | Payer: 59 | Source: Ambulatory Visit | Attending: Obstetrics and Gynecology | Admitting: Obstetrics and Gynecology

## 2015-10-11 DIAGNOSIS — Z1239 Encounter for other screening for malignant neoplasm of breast: Secondary | ICD-10-CM

## 2015-10-11 NOTE — Telephone Encounter (Signed)
Per Dr Elly Modena, endometrial biopsy was insufficient. Dr. Loni Muse, will proceed with the surgery as planned. Patient's pap was negative. Called patient, no answer- left message stating we are trying to reach you with non urgent results, please call us back at the clinics

## 2015-10-12 ENCOUNTER — Other Ambulatory Visit: Payer: Self-pay | Admitting: Obstetrics and Gynecology

## 2015-10-12 DIAGNOSIS — R928 Other abnormal and inconclusive findings on diagnostic imaging of breast: Secondary | ICD-10-CM

## 2015-10-12 NOTE — Telephone Encounter (Signed)
Called Selena Smith and notified her per Dr. Elly Modena endometrial biopsy was sufficient and will proceed with surgery as scheduled; pap was normal   She had no questions and voiced understanding.

## 2015-10-13 ENCOUNTER — Ambulatory Visit
Admission: RE | Admit: 2015-10-13 | Discharge: 2015-10-13 | Disposition: A | Discharge status: Home / Self Care | Payer: 59 | Source: Ambulatory Visit | Attending: Obstetrics and Gynecology | Admitting: Obstetrics and Gynecology

## 2015-10-13 DIAGNOSIS — R928 Other abnormal and inconclusive findings on diagnostic imaging of breast: Secondary | ICD-10-CM

## 2015-10-16 MED ORDER — DEXTROSE 5 % IV SOLN
2.0000 g | INTRAVENOUS | Status: AC
  Administered 2015-10-17: 2 g via INTRAVENOUS
  Filled 2015-10-16: qty 2

## 2015-10-17 ENCOUNTER — Encounter (HOSPITAL_COMMUNITY)
Admission: RE | Disposition: A | Discharge status: Home / Self Care | Payer: Self-pay | Source: Ambulatory Visit | Attending: Obstetrics & Gynecology

## 2015-10-17 ENCOUNTER — Inpatient Hospital Stay (HOSPITAL_COMMUNITY): Payer: 59 | Admitting: Anesthesiology

## 2015-10-17 ENCOUNTER — Encounter (HOSPITAL_COMMUNITY): Payer: Self-pay | Admitting: *Deleted

## 2015-10-17 ENCOUNTER — Inpatient Hospital Stay (HOSPITAL_COMMUNITY)
Admission: RE | Admit: 2015-10-17 | Discharge: 2015-10-19 | DRG: 743 | Disposition: A | Discharge status: Home / Self Care | Payer: 59 | Source: Ambulatory Visit | Attending: Obstetrics & Gynecology | Admitting: Obstetrics & Gynecology

## 2015-10-17 DIAGNOSIS — D219 Benign neoplasm of connective and other soft tissue, unspecified: Secondary | ICD-10-CM | POA: Diagnosis present

## 2015-10-17 DIAGNOSIS — Z9079 Acquired absence of other genital organ(s): Secondary | ICD-10-CM

## 2015-10-17 DIAGNOSIS — N939 Abnormal uterine and vaginal bleeding, unspecified: Secondary | ICD-10-CM | POA: Diagnosis present

## 2015-10-17 DIAGNOSIS — D259 Leiomyoma of uterus, unspecified: Secondary | ICD-10-CM | POA: Diagnosis present

## 2015-10-17 DIAGNOSIS — Z90710 Acquired absence of both cervix and uterus: Secondary | ICD-10-CM

## 2015-10-17 DIAGNOSIS — F17210 Nicotine dependence, cigarettes, uncomplicated: Secondary | ICD-10-CM | POA: Diagnosis present

## 2015-10-17 DIAGNOSIS — Z90722 Acquired absence of ovaries, bilateral: Secondary | ICD-10-CM

## 2015-10-17 HISTORY — PX: ABDOMINAL HYSTERECTOMY: SHX81

## 2015-10-17 LAB — PREGNANCY, URINE: Preg Test, Ur: NEGATIVE

## 2015-10-17 LAB — TYPE AND SCREEN
ABO/RH(D): B POS
ANTIBODY SCREEN: NEGATIVE

## 2015-10-17 SURGERY — HYSTERECTOMY, TOTAL, ABDOMINAL, WITH SALPINGECTOMY
Anesthesia: General | Site: Abdomen | Laterality: Bilateral

## 2015-10-17 MED ORDER — KETOROLAC TROMETHAMINE 30 MG/ML IJ SOLN
INTRAMUSCULAR | Status: AC
  Filled 2015-10-17: qty 1

## 2015-10-17 MED ORDER — OXYCODONE-ACETAMINOPHEN 5-325 MG PO TABS
1.0000 | ORAL_TABLET | ORAL | Status: DC | PRN
  Administered 2015-10-18: 2 via ORAL
  Administered 2015-10-18: 1 via ORAL
  Administered 2015-10-18 (×2): 2 via ORAL
  Administered 2015-10-19 (×2): 1 via ORAL
  Filled 2015-10-17: qty 2
  Filled 2015-10-17 (×2): qty 1
  Filled 2015-10-17: qty 2
  Filled 2015-10-17: qty 1
  Filled 2015-10-17: qty 2

## 2015-10-17 MED ORDER — LACTATED RINGERS IV SOLN: INTRAVENOUS | Status: DC

## 2015-10-17 MED ORDER — FENTANYL CITRATE (PF) 250 MCG/5ML IJ SOLN
INTRAMUSCULAR | Status: AC
  Filled 2015-10-17: qty 5

## 2015-10-17 MED ORDER — MIDAZOLAM HCL 2 MG/2ML IJ SOLN
INTRAMUSCULAR | Status: AC
  Filled 2015-10-17: qty 2

## 2015-10-17 MED ORDER — PNEUMOCOCCAL VAC POLYVALENT 25 MCG/0.5ML IJ INJ
0.5000 mL | INJECTION | INTRAMUSCULAR | Status: AC
  Administered 2015-10-18: 0.5 mL via INTRAMUSCULAR
  Filled 2015-10-17: qty 0.5

## 2015-10-17 MED ORDER — LIDOCAINE HCL (CARDIAC) 20 MG/ML IV SOLN
INTRAVENOUS | Status: DC | PRN
  Administered 2015-10-17: 60 mg via INTRAVENOUS

## 2015-10-17 MED ORDER — DOCUSATE SODIUM 100 MG PO CAPS
100.0000 mg | ORAL_CAPSULE | Freq: Two times a day (BID) | ORAL | Status: DC
  Administered 2015-10-17 – 2015-10-19 (×4): 100 mg via ORAL
  Filled 2015-10-17 (×4): qty 1

## 2015-10-17 MED ORDER — MEPERIDINE HCL 25 MG/ML IJ SOLN: 6.2500 mg | INTRAMUSCULAR | Status: DC | PRN

## 2015-10-17 MED ORDER — ONDANSETRON HCL 4 MG PO TABS: 4.0000 mg | ORAL_TABLET | Freq: Four times a day (QID) | ORAL | Status: DC | PRN

## 2015-10-17 MED ORDER — ALUM & MAG HYDROXIDE-SIMETH 200-200-20 MG/5ML PO SUSP: 30.0000 mL | ORAL | Status: DC | PRN

## 2015-10-17 MED ORDER — HYDROMORPHONE HCL 1 MG/ML IJ SOLN
0.2500 mg | INTRAMUSCULAR | Status: DC | PRN
  Administered 2015-10-17 (×4): 0.5 mg via INTRAVENOUS

## 2015-10-17 MED ORDER — PROPOFOL 10 MG/ML IV BOLUS
INTRAVENOUS | Status: DC | PRN
  Administered 2015-10-17: 200 mg via INTRAVENOUS

## 2015-10-17 MED ORDER — 0.9 % SODIUM CHLORIDE (POUR BTL) OPTIME
TOPICAL | Status: DC | PRN
  Administered 2015-10-17: 1000 mL

## 2015-10-17 MED ORDER — KETOROLAC TROMETHAMINE 30 MG/ML IJ SOLN
30.0000 mg | Freq: Once | INTRAMUSCULAR | Status: AC
  Administered 2015-10-17: 30 mg via INTRAVENOUS
  Filled 2015-10-17: qty 1

## 2015-10-17 MED ORDER — SODIUM CHLORIDE 0.9% FLUSH: 9.0000 mL | INTRAVENOUS | Status: DC | PRN

## 2015-10-17 MED ORDER — ONDANSETRON HCL 4 MG/2ML IJ SOLN
INTRAMUSCULAR | Status: AC
  Filled 2015-10-17: qty 2

## 2015-10-17 MED ORDER — MENTHOL 3 MG MT LOZG: 1.0000 | LOZENGE | OROMUCOSAL | Status: DC | PRN

## 2015-10-17 MED ORDER — HYDROMORPHONE HCL 1 MG/ML IJ SOLN
INTRAMUSCULAR | Status: AC
  Filled 2015-10-17: qty 1

## 2015-10-17 MED ORDER — SCOPOLAMINE 1 MG/3DAYS TD PT72
1.0000 | MEDICATED_PATCH | TRANSDERMAL | Status: DC
  Administered 2015-10-17: 1.5 mg via TRANSDERMAL

## 2015-10-17 MED ORDER — HYDROMORPHONE 1 MG/ML IV SOLN
INTRAVENOUS | Status: DC
  Administered 2015-10-17: 18:00:00 via INTRAVENOUS
  Administered 2015-10-17: 3.6 mg via INTRAVENOUS
  Administered 2015-10-18: 2.7 mg via INTRAVENOUS
  Filled 2015-10-17: qty 25

## 2015-10-17 MED ORDER — ROCURONIUM BROMIDE 100 MG/10ML IV SOLN
INTRAVENOUS | Status: DC | PRN
  Administered 2015-10-17 (×2): 10 mg via INTRAVENOUS
  Administered 2015-10-17: 40 mg via INTRAVENOUS

## 2015-10-17 MED ORDER — BUPIVACAINE HCL (PF) 0.5 % IJ SOLN
INTRAMUSCULAR | Status: AC
  Filled 2015-10-17: qty 30

## 2015-10-17 MED ORDER — ONDANSETRON HCL 4 MG/2ML IJ SOLN: 4.0000 mg | Freq: Four times a day (QID) | INTRAMUSCULAR | Status: DC | PRN

## 2015-10-17 MED ORDER — NALOXONE HCL 0.4 MG/ML IJ SOLN: 0.4000 mg | INTRAMUSCULAR | Status: DC | PRN

## 2015-10-17 MED ORDER — MIDAZOLAM HCL 2 MG/2ML IJ SOLN
INTRAMUSCULAR | Status: DC | PRN
Start: 2015-10-17 — End: 2015-10-17
  Administered 2015-10-17: 2 mg via INTRAVENOUS

## 2015-10-17 MED ORDER — KETOROLAC TROMETHAMINE 30 MG/ML IJ SOLN
INTRAMUSCULAR | Status: DC | PRN
  Administered 2015-10-17: 30 mg via INTRAVENOUS

## 2015-10-17 MED ORDER — HYDROMORPHONE HCL 1 MG/ML IJ SOLN: 0.2000 mg | INTRAMUSCULAR | Status: DC | PRN

## 2015-10-17 MED ORDER — DEXAMETHASONE SODIUM PHOSPHATE 4 MG/ML IJ SOLN
INTRAMUSCULAR | Status: AC
  Filled 2015-10-17: qty 1

## 2015-10-17 MED ORDER — BUPIVACAINE HCL (PF) 0.5 % IJ SOLN
INTRAMUSCULAR | Status: DC | PRN
  Administered 2015-10-17: 20 mL

## 2015-10-17 MED ORDER — SIMETHICONE 80 MG PO CHEW: 80.0000 mg | CHEWABLE_TABLET | Freq: Four times a day (QID) | ORAL | Status: DC | PRN

## 2015-10-17 MED ORDER — INFLUENZA VAC SPLIT QUAD 0.5 ML IM SUSY
0.5000 mL | PREFILLED_SYRINGE | INTRAMUSCULAR | Status: AC
  Administered 2015-10-19: 0.5 mL via INTRAMUSCULAR
  Filled 2015-10-17: qty 0.5

## 2015-10-17 MED ORDER — ONDANSETRON HCL 4 MG/2ML IJ SOLN
INTRAMUSCULAR | Status: DC | PRN
  Administered 2015-10-17: 4 mg via INTRAVENOUS

## 2015-10-17 MED ORDER — CEFAZOLIN SODIUM-DEXTROSE 2-3 GM-% IV SOLR
INTRAVENOUS | Status: AC
  Filled 2015-10-17: qty 50

## 2015-10-17 MED ORDER — GLYCOPYRROLATE 0.2 MG/ML IJ SOLN
INTRAMUSCULAR | Status: DC | PRN
  Administered 2015-10-17: 0.4 mg via INTRAVENOUS

## 2015-10-17 MED ORDER — DIPHENHYDRAMINE HCL 12.5 MG/5ML PO ELIX: 12.5000 mg | ORAL_SOLUTION | Freq: Four times a day (QID) | ORAL | Status: DC | PRN

## 2015-10-17 MED ORDER — SENNOSIDES-DOCUSATE SODIUM 8.6-50 MG PO TABS
1.0000 | ORAL_TABLET | Freq: Every evening | ORAL | Status: DC | PRN
Start: 2015-10-17 — End: 2015-10-19

## 2015-10-17 MED ORDER — HYDROMORPHONE HCL 1 MG/ML IJ SOLN
INTRAMUSCULAR | Status: AC
  Administered 2015-10-17: 0.5 mg via INTRAVENOUS
  Filled 2015-10-17: qty 1

## 2015-10-17 MED ORDER — DIPHENHYDRAMINE HCL 50 MG/ML IJ SOLN: 12.5000 mg | Freq: Four times a day (QID) | INTRAMUSCULAR | Status: DC | PRN

## 2015-10-17 MED ORDER — GLYCOPYRROLATE 0.2 MG/ML IJ SOLN
INTRAMUSCULAR | Status: AC
  Filled 2015-10-17: qty 2

## 2015-10-17 MED ORDER — PROPOFOL 10 MG/ML IV BOLUS
INTRAVENOUS | Status: AC
  Filled 2015-10-17: qty 20

## 2015-10-17 MED ORDER — FENTANYL CITRATE (PF) 100 MCG/2ML IJ SOLN
INTRAMUSCULAR | Status: DC | PRN
  Administered 2015-10-17 (×3): 50 ug via INTRAVENOUS
  Administered 2015-10-17 (×2): 100 ug via INTRAVENOUS

## 2015-10-17 MED ORDER — SCOPOLAMINE 1 MG/3DAYS TD PT72
MEDICATED_PATCH | TRANSDERMAL | Status: AC
  Administered 2015-10-17: 1.5 mg via TRANSDERMAL
  Filled 2015-10-17: qty 1

## 2015-10-17 MED ORDER — LACTATED RINGERS IV SOLN
INTRAVENOUS | Status: DC
  Administered 2015-10-17: 14:00:00 via INTRAVENOUS

## 2015-10-17 MED ORDER — LIDOCAINE HCL (CARDIAC) 20 MG/ML IV SOLN
INTRAVENOUS | Status: AC
  Filled 2015-10-17: qty 5

## 2015-10-17 MED ORDER — FENTANYL CITRATE (PF) 100 MCG/2ML IJ SOLN
INTRAMUSCULAR | Status: AC
Start: 2015-10-17 — End: 2015-10-17
  Filled 2015-10-17: qty 2

## 2015-10-17 MED ORDER — NEOSTIGMINE METHYLSULFATE 10 MG/10ML IV SOLN
INTRAVENOUS | Status: DC | PRN
  Administered 2015-10-17: 2 mg via INTRAVENOUS

## 2015-10-17 MED ORDER — DEXAMETHASONE SODIUM PHOSPHATE 10 MG/ML IJ SOLN
INTRAMUSCULAR | Status: DC | PRN
  Administered 2015-10-17: 4 mg via INTRAVENOUS

## 2015-10-17 MED ORDER — MAGNESIUM CITRATE PO SOLN
1.0000 | Freq: Once | ORAL | Status: DC | PRN
  Filled 2015-10-17: qty 296

## 2015-10-17 MED ORDER — OXYCODONE HCL 5 MG PO TABS: 5.0000 mg | ORAL_TABLET | Freq: Once | ORAL | Status: DC | PRN

## 2015-10-17 MED ORDER — PANTOPRAZOLE SODIUM 40 MG PO TBEC
40.0000 mg | DELAYED_RELEASE_TABLET | Freq: Every day | ORAL | Status: DC
  Administered 2015-10-18 – 2015-10-19 (×2): 40 mg via ORAL
  Filled 2015-10-17 (×2): qty 1

## 2015-10-17 MED ORDER — OXYCODONE HCL 5 MG/5ML PO SOLN: 5.0000 mg | Freq: Once | ORAL | Status: DC | PRN

## 2015-10-17 MED ORDER — NEOSTIGMINE METHYLSULFATE 10 MG/10ML IV SOLN
INTRAVENOUS | Status: AC
  Filled 2015-10-17: qty 1

## 2015-10-17 MED ORDER — HYDROMORPHONE HCL 1 MG/ML IJ SOLN
INTRAMUSCULAR | Status: DC | PRN
  Administered 2015-10-17 (×2): 0.5 mg via INTRAVENOUS

## 2015-10-17 MED ORDER — IBUPROFEN 600 MG PO TABS
600.0000 mg | ORAL_TABLET | Freq: Four times a day (QID) | ORAL | Status: DC | PRN
  Administered 2015-10-18 – 2015-10-19 (×3): 600 mg via ORAL
  Filled 2015-10-17 (×3): qty 1

## 2015-10-17 MED ORDER — LACTATED RINGERS IV SOLN
INTRAVENOUS | Status: DC
  Administered 2015-10-17 (×2): via INTRAVENOUS

## 2015-10-17 MED ORDER — BISACODYL 10 MG RE SUPP: 10.0000 mg | Freq: Every day | RECTAL | Status: DC | PRN

## 2015-10-17 MED ORDER — ZOLPIDEM TARTRATE 5 MG PO TABS: 5.0000 mg | ORAL_TABLET | Freq: Every evening | ORAL | Status: DC | PRN

## 2015-10-17 SURGICAL SUPPLY — 48 items
APL SKNCLS STERI-STRIP NONHPOA (GAUZE/BANDAGES/DRESSINGS)
BARRIER ADHS 3X4 INTERCEED (GAUZE/BANDAGES/DRESSINGS) IMPLANT
BENZOIN TINCTURE PRP APPL 2/3 (GAUZE/BANDAGES/DRESSINGS) IMPLANT
BRR ADH 4X3 ABS CNTRL BYND (GAUZE/BANDAGES/DRESSINGS)
CANISTER SUCT 3000ML (MISCELLANEOUS) ×3 IMPLANT
CELLS DAT CNTRL 66122 CELL SVR (MISCELLANEOUS) IMPLANT
CLOSURE WOUND 1/2 X4 (GAUZE/BANDAGES/DRESSINGS) ×1
CLOTH BEACON ORANGE TIMEOUT ST (SAFETY) ×3 IMPLANT
CONT PATH 16OZ SNAP LID 3702 (MISCELLANEOUS) ×3 IMPLANT
DECANTER SPIKE VIAL GLASS SM (MISCELLANEOUS) ×2 IMPLANT
DRAPE WARM FLUID 44X44 (DRAPE) ×2 IMPLANT
DRSG OPSITE POSTOP 4X10 (GAUZE/BANDAGES/DRESSINGS) ×3 IMPLANT
DURAPREP 26ML APPLICATOR (WOUND CARE) ×3 IMPLANT
ELECT LIGASURE SHORT 9 REUSE (ELECTRODE) IMPLANT
GAUZE SPONGE 4X4 16PLY XRAY LF (GAUZE/BANDAGES/DRESSINGS) ×2 IMPLANT
GLOVE BIOGEL PI IND STRL 7.0 (GLOVE) ×3 IMPLANT
GLOVE BIOGEL PI INDICATOR 7.0 (GLOVE) ×6
GLOVE ECLIPSE 7.0 STRL STRAW (GLOVE) ×3 IMPLANT
GOWN STRL REUS W/TWL LRG LVL3 (GOWN DISPOSABLE) ×6 IMPLANT
HEMOSTAT SURGICEL 2X3 (HEMOSTASIS) ×2 IMPLANT
NEEDLE HYPO 22GX1.5 SAFETY (NEEDLE) ×3 IMPLANT
NS IRRIG 1000ML POUR BTL (IV SOLUTION) ×3 IMPLANT
PACK ABDOMINAL GYN (CUSTOM PROCEDURE TRAY) ×3 IMPLANT
PAD OB MATERNITY 4.3X12.25 (PERSONAL CARE ITEMS) ×3 IMPLANT
PENCIL SMOKE EVAC W/HOLSTER (ELECTROSURGICAL) ×3 IMPLANT
RETRACTOR WND ALEXIS 18 MED (MISCELLANEOUS) IMPLANT
RETRACTOR WND ALEXIS 25 LRG (MISCELLANEOUS) IMPLANT
RTRCTR WOUND ALEXIS 18CM MED (MISCELLANEOUS)
RTRCTR WOUND ALEXIS 25CM LRG (MISCELLANEOUS)
SPONGE GAUZE 4X4 12PLY STER LF (GAUZE/BANDAGES/DRESSINGS) ×3 IMPLANT
SPONGE LAP 18X18 X RAY DECT (DISPOSABLE) ×4 IMPLANT
STAPLER VISISTAT 35W (STAPLE) IMPLANT
STRIP CLOSURE SKIN 1/2X4 (GAUZE/BANDAGES/DRESSINGS) ×2 IMPLANT
SUT PDS AB 0 CTX 60 (SUTURE) IMPLANT
SUT PLAIN 2 0 (SUTURE)
SUT PLAIN ABS 2-0 CT1 27XMFL (SUTURE) IMPLANT
SUT VIC AB 0 CT1 18XCR BRD8 (SUTURE) ×3 IMPLANT
SUT VIC AB 0 CT1 27 (SUTURE) ×3
SUT VIC AB 0 CT1 27XBRD ANBCTR (SUTURE) ×1 IMPLANT
SUT VIC AB 0 CT1 8-18 (SUTURE) ×9
SUT VIC AB 0 CTX 36 (SUTURE)
SUT VIC AB 0 CTX36XBRD ANBCTRL (SUTURE) IMPLANT
SUT VIC AB 4-0 KS 27 (SUTURE) ×2 IMPLANT
SUT VICRYL 0 TIES 12 18 (SUTURE) ×3 IMPLANT
SYR CONTROL 10ML LL (SYRINGE) ×3 IMPLANT
TOWEL OR 17X24 6PK STRL BLUE (TOWEL DISPOSABLE) ×6 IMPLANT
TRAY FOLEY CATH SILVER 14FR (SET/KITS/TRAYS/PACK) ×3 IMPLANT
WATER STERILE IRR 1000ML POUR (IV SOLUTION) ×1 IMPLANT

## 2015-10-17 NOTE — Anesthesia Preprocedure Evaluation (Addendum)
Anesthesia Evaluation  Patient identified by MRN, date of birth, ID band Patient awake    Reviewed: Allergy & Precautions, NPO status , Patient's Chart, lab work & pertinent test results  Airway Mallampati: I  TM Distance: >3 FB Neck ROM: Full    Dental  (+) Teeth Intact, Dental Advisory Given   Pulmonary Current Smoker,  breath sounds clear to auscultation        Cardiovascular Rhythm:Regular Rate:Normal     Neuro/Psych    GI/Hepatic   Endo/Other    Renal/GU      Musculoskeletal   Abdominal   Peds  Hematology   Anesthesia Other Findings   Reproductive/Obstetrics                             Anesthesia Physical Anesthesia Plan  ASA: I  Anesthesia Plan: General   Post-op Pain Management:    Induction: Intravenous  Airway Management Planned: Oral ETT  Additional Equipment:   Intra-op Plan:   Post-operative Plan: Extubation in OR  Informed Consent: I have reviewed the patients History and Physical, chart, labs and discussed the procedure including the risks, benefits and alternatives for the proposed anesthesia with the patient or authorized representative who has indicated his/her understanding and acceptance.   Dental advisory given  Plan Discussed with: CRNA, Anesthesiologist and Surgeon  Anesthesia Plan Comments:         Anesthesia Quick Evaluation  

## 2015-10-17 NOTE — Transfer of Care (Signed)
Immediate Anesthesia Transfer of Care Note  Patient: Selena Smith  Procedure(s) Performed: Procedure(s): HYSTERECTOMY ABDOMINAL WITH SALPINGECTOMY (Bilateral)  Patient Location: PACU  Anesthesia Type:General  Level of Consciousness: awake, alert , oriented and patient cooperative  Airway & Oxygen Therapy: Patient connected to nasal cannula oxygen  Post-op Assessment: Report given to RN, Post -op Vital signs reviewed and stable and Patient moving all extremities  Post vital signs: Reviewed and stable  Last Vitals:  Filed Vitals:   10/17/15 1318  BP: 109/72  Pulse: 70  Temp: 36.7 C  Resp: 20    Complications: No apparent anesthesia complications

## 2015-10-17 NOTE — Op Note (Signed)
Selena Smith PROCEDURE DATE: 10/17/2015  PREOPERATIVE DIAGNOSES:  Symptomatic fibroids, abnormal uterine bleeding POSTOPERATIVE DIAGNOSES:  The same SURGEON:   Verita Schneiders, M.D. ASSISTANT: Emeterio Reeve, M.D. OPERATION:  Total abdominal hysterectomy, Bilateral Salpingectomy ANESTHESIA:  General endotracheal.  INDICATIONS: The patient is a 40 y.o. [id number] with the aforementioned diagnoses who desires definitive surgical management. On the preoperative visit, the risks, benefits, indications, and alternatives of the procedure were reviewed with the patient.  On the day of surgery, the risks of surgery were again discussed with the patient including but not limited to: bleeding which may require transfusion or reoperation; infection which may require antibiotics; injury to bowel, bladder, ureters or other surrounding organs; need for additional procedures; thromboembolic phenomenon, incisional problems and other postoperative/anesthesia complications. Written informed consent was obtained.    OPERATIVE FINDINGS: A 16 week size fibroid uterus with normal tubes and ovaries bilaterally.  ESTIMATED BLOOD LOSS: 100 ml FLUIDS:  2300 ml of Lactated Ringers URINE OUTPUT:  10 ml of clear yellow urine. SPECIMENS:  Uterus,cervix,  bilateral fallopian tubes sent to pathology COMPLICATIONS:  None immediate.   DESCRIPTION OF PROCEDURE:  The patient received intravenous antibiotics and had sequential compression devices applied to her lower extremities while in the preoperative area.   She was taken to the operating room and placed under general anesthesia without difficulty.The abdomen and perineum were prepped and draped in a sterile manner, and she was placed in a dorsal supine position.  A Foley catheter was inserted into the bladder and attached to constant drainage. After an adequate timeout was performed, a Pfannensteil skin incision was made. This incision was taken down to the fascia using  electrocautery with care given to maintain good hemostasis. The fascia was incised in the midline and the fascial incision was then extended bilaterally using electrocautery without difficulty. The fascia was then dissected off the underlying rectus muscles using blunt and sharp dissection. The rectus muscles were split bluntly in the midline and the peritoneum entered sharply without complication. This peritoneal incision was then extended superiorly and inferiorly with care given to prevent bowel or bladder injury. Attention was then turned to the pelvis. A retractor was placed into the incision, and the bowel was packed away with moist laparotomy sponges. The uterus at this point was noted to be mobilized and was delivered up out of the abdomen.  The round ligaments on each side were clamped, suture ligated with 0 Vicryl, and transected with electrocautery allowing entry into the broad ligament. Of note, all sutures used in this procedure are 0 Vicryl unless otherwise noted. The anterior and posterior leaves of the broad ligament were separated, and the ureters were inspected to be safely away from the area of dissection bilaterally.  Adnexae were clamped on the patient's right side, cut, and doubly suture ligated. This procedure was repeated in an identical fashion on the left site allowing for both adnexa to remain in place.  Kelly clamps were placed on the mesosalpinx of the right fallopian tube, and the fallopian tube was excised.  The pedicle was then secured with a free tie.  A similar process was carried out on the left side, allowing for bilateral salpingectomy.  A bladder flap was then created.  The bladder was then bluntly dissected off the lower uterine segment and cervix with good hemostasis noted. The uterine arteries were then skeletonized bilaterally and then clamped, cut, and doubly suture ligated with care given to prevent ureteral injury.  The uterosacral ligaments were then  clamped, cut, and  ligated bilaterally.  Finally, the cardinal ligaments were clamped, cut, and ligated bilaterally.  Acutely curved clamps were placed across the vagina just under the cervix, and the specimen was amputated and sent to pathology. The vaginal cuff angles were closed with Heaney stiches with care given to incorporate the uterosacral-cardinal ligament pedicles on both sides. The middle of the vaginal cuff was closed with a series of interrupted figure-of-eight sutures with care given to incorporate the anterior pubocervical fascia and the posterior rectovaginal fascia.   The pelvis was irrigated and hemostasis was reconfirmed at all pedicles and along the pelvic sidewall.  The ureters were inspected and noted to be peristalsing bilaterally.  All laparotomy sponges and instruments were removed from the abdomen. The peritoneum was closed with a running stitch, and the fascia was also closed in a running fashion. The subcutaneous layer was reapproximated with 2-0 plain gut. The skin was closed with a 4-0 Vicryl subcuticular stitch. Sponge, lap, needle, and instrument counts were correct times two. The patient was taken to the recovery area awake, extubated and in stable condition.   Verita Schneiders, MD, [facility], [facility]

## 2015-10-17 NOTE — Anesthesia Procedure Notes (Signed)
Procedure Name: Intubation Date/Time: 10/17/2015 2:05 PM Performed by: Jonna Munro Pre-anesthesia Checklist: Patient identified, Emergency Drugs available, Suction available, Timeout performed and Patient being monitored Patient Re-evaluated:Patient Re-evaluated prior to inductionOxygen Delivery Method: Circle system utilized Preoxygenation: Pre-oxygenation with 100% oxygen Intubation Type: IV induction Ventilation: Mask ventilation without difficulty Laryngoscope Size: Mac and 3 Grade View: Grade I Tube type: Oral Tube size: 7.0 mm Number of attempts: 1 Airway Equipment and Method: Stylet Placement Confirmation: ETT inserted through vocal cords under direct vision,  positive ETCO2 and breath sounds checked- equal and bilateral Secured at: 21 cm Tube secured with: Tape Dental Injury: Teeth and Oropharynx as per pre-operative assessment

## 2015-10-17 NOTE — H&P (Signed)
Preoperative History and Physical  Selena Smith is a 40 y.o. G2P0011 here for surgical management of fibroids and associated AUB.   No significant preoperative concerns.  Proposed Procedures: Total Abdominal Hysterectomy, Bilateral Salpingectomy  Past Medical History  Diagnosis Date  . Medical history non-contributory    Past Surgical History  Procedure Laterality Date  . No past surgeries     OB History  Gravida Para Term Preterm AB SAB TAB Ectopic Multiple Living  2    1 1    1     # Outcome Date GA Lbr Len/2nd Weight Sex Delivery Anes PTL Lv  2 Gravida           1 SAB             Patient denies any other pertinent gynecologic issues.   No current facility-administered medications on file prior to encounter.   Current Outpatient Prescriptions on File Prior to Encounter  Medication Sig Dispense Refill  . ibuprofen (ADVIL,MOTRIN) 800 MG tablet Take 1 tablet (800 mg total) by mouth 3 (three) times daily as needed for moderate pain or cramping. 30 tablet 3  . megestrol (MEGACE) 40 MG tablet Take 1 tablet (40 mg total) by mouth 2 (two) times daily. Can increase to two tablets twice a day for heavy bleeding 60 tablet 8   No Known Allergies  Social History:   reports that she has been smoking Cigarettes.  She has been smoking about 0.25 packs per day. She does not have any smokeless tobacco history on file. She reports that she does not drink alcohol or use illicit drugs.  No family history on file.  Review of Systems: Noncontributory  PHYSICAL EXAM: Blood pressure 109/72, pulse 70, temperature 98 F (36.7 C), temperature source Oral, resp. rate 20, SpO2 100 %. CONSTITUTIONAL: Well-developed, well-nourished female in no acute distress.  HENT:  Normocephalic, atraumatic, External right and left ear normal. Oropharynx is clear and moist EYES: Conjunctivae and EOM are normal. Pupils are equal, round, and reactive to light. No scleral icterus.  NECK: Normal range of motion,  supple, no masses SKIN: Skin is warm and dry. No rash noted. Not diaphoretic. No erythema. No pallor. [place]: Alert and oriented to person, place, and time. Normal reflexes, muscle tone coordination. No cranial nerve deficit noted. PSYCHIATRIC: Normal mood and affect. Normal behavior. Normal judgment and thought content. CARDIOVASCULAR: Normal heart rate noted, regular rhythm RESPIRATORY: Effort and breath sounds normal, no problems with respiration noted ABDOMEN: Soft, nontender, nondistended. PELVIC: Deferred MUSCULOSKELETAL: Normal range of motion. No edema and no tenderness. 2+ distal pulses.  Labs: Results for orders placed or performed during the hospital encounter of 10/17/15 (from the past 336 hour(s))  Pregnancy, urine   Collection Time: 10/17/15  1:18 PM  Result Value Ref Range   Preg Test, Ur NEGATIVE NEGATIVE  Type and screen [place]   Collection Time: 10/17/15  1:18 PM  Result Value Ref Range   ABO/RH(D) B POS    Antibody Screen PENDING    Sample Expiration 10/20/2015     Imaging Studies: 08/18/2015 CLINICAL DATA: Abnormal uterine bleeding. Bleeding with pelvic pain right greater than left since 07/28/2015. Gravida 2 para 1 SAB 1. LMP 07/28/2015. EXAM: TRANSABDOMINAL AND TRANSVAGINAL ULTRASOUND OF PELVIS TECHNIQUE: Both transabdominal and transvaginal ultrasound examinations of the pelvis were performed. Transabdominal technique was performed for global imaging of the pelvis including uterus, ovaries, adnexal regions, and pelvic cul-de-sac. It was necessary to proceed with endovaginal exam following  the transabdominal exam to visualize the endometrium and ovaries. COMPARISON: MRI 05/27/2012 FINDINGS: Uterus Measurements: At least 13.5 x 6.9 x 10.6 cm. Uterus is enlarged by multiple uterine fibroids are present. Left fundal fibroid is 5.3 x 4.8 x 5.8 cm. Fundal fibroid is 2.1 x 2.2 x 2.3 cm. Right fundal fibroid is 3.0 x 2.8 x 3.2 cm. Central fibroid  is 1.7 x 1.3 x 2.0 cm. Central fibroid is 1.9 x 1.9 x 2.1 cm. Central posterior fibroid is 2.3 x 1.7 x 2.2 cm. Possible submucosal component associated with this fibroid. Endometrium Thickness: 9.2 mm. Difficult to visualize given the presence of multiple fibroids. The contour of the endometrium is distorted by fibroids. Right ovary Measurements: 3.7 x 2.4 x 4.3 cm. Normal appearance/no adnexal mass. Left ovary Measurements: 5.1 x 2.0 x 5.0 cm. Para ovarian cyst is 3.0 cm. Other findings Trace free pelvic fluid. IMPRESSION: 1. Multiple uterine fibroids may account for the patient's abnormal uterine bleeding. 2. The endometrial stripe is normal in thickness. If bleeding remains unresponsive to hormonal or medical therapy, sonohysterogram should be considered for focal lesion work-up. (Ref: Radiological Reasoning: Algorithmic Workup of Abnormal Vaginal Bleeding with Endovaginal Sonography and Sonohysterography. AJR 2008; [id number]) 3. Normal appearance of the ovaries. Note is made of left paraovarian cyst. Electronically Signed By: Nolon Nations M.D. On: 08/18/2015 14:19   09/29/15 Normal pap and negative HRHPV.  Benign endocervical cells on biopsy, no endometrial cells  Assessment: Patient Active Problem List   Diagnosis Date Noted  . Fibroids 09/04/2015  . Abnormal uterine bleeding (AUB) 09/04/2015    Plan: Patient will undergo surgical management with total abdominal hysterectomy, bilateral salpingectomy.   The risks of surgery were discussed in detail with the patient including but not limited to: bleeding which may require transfusion or reoperation; infection which may require antibiotics; injury to surrounding organs which may involve bowel, bladder, ureters ; need for additional procedures; thromboembolic phenomenon, surgical site problems and other postoperative/anesthesia complications. Likelihood of success in alleviating the patient's condition was discussed. Routine postoperative  instructions will be reviewed with the patient and her family in detail after surgery.  The patient concurred with the proposed plan, giving informed written consent for the surgery.  Patient has been NPO since last night she will remain NPO for procedure.  Anesthesia and OR aware.  Preoperative prophylactic antibiotics and SCDs ordered on call to the OR.  To OR when ready.  Verita Schneiders, M.D. 10/17/2015 1:54 PM

## 2015-10-17 NOTE — Anesthesia Postprocedure Evaluation (Signed)
Anesthesia Post Note  Patient: Selena Smith  Procedure(s) Performed: Procedure(s) (LRB): HYSTERECTOMY ABDOMINAL WITH SALPINGECTOMY (Bilateral)  Patient location during evaluation: PACU Anesthesia Type: General Level of consciousness: awake Pain management: pain level controlled Vital Signs Assessment: post-procedure vital signs reviewed and stable Respiratory status: spontaneous breathing Cardiovascular status: stable Postop Assessment: no signs of nausea or vomiting Anesthetic complications: no    Last Vitals:  Filed Vitals:   10/17/15 1715 10/17/15 1730  BP: 120/69 119/66  Pulse: 71 84  Temp:  37.3 C  Resp: 16 15    Last Pain:  Filed Vitals:   10/17/15 1738  PainSc: [place]

## 2015-10-18 ENCOUNTER — Encounter (HOSPITAL_COMMUNITY): Payer: Self-pay | Admitting: Obstetrics & Gynecology

## 2015-10-18 LAB — COMPREHENSIVE METABOLIC PANEL
ALT: 15 U/L (ref 14–54)
ANION GAP: 7 (ref 5–15)
AST: 23 U/L (ref 15–41)
Albumin: 3.4 g/dL — ABNORMAL LOW (ref 3.5–5.0)
Alkaline Phosphatase: 54 U/L (ref 38–126)
BUN: 9 mg/dL (ref 6–20)
CALCIUM: 8.6 mg/dL — AB (ref 8.9–10.3)
CO2: 23 mmol/L (ref 22–32)
CREATININE: 0.65 mg/dL (ref 0.44–1.00)
Chloride: 105 mmol/L (ref 101–111)
GLUCOSE: 114 mg/dL — AB (ref 65–99)
Potassium: 3.6 mmol/L (ref 3.5–5.1)
SODIUM: 135 mmol/L (ref 135–145)
Total Bilirubin: 1.1 mg/dL (ref 0.3–1.2)
Total Protein: 7 g/dL (ref 6.5–8.1)

## 2015-10-18 LAB — CBC
HCT: 30.9 % — ABNORMAL LOW (ref 36.0–46.0)
Hemoglobin: 10.1 g/dL — ABNORMAL LOW (ref 12.0–15.0)
MCH: 28.1 pg (ref 26.0–34.0)
MCHC: 32.7 g/dL (ref 30.0–36.0)
MCV: 85.8 fL (ref 78.0–100.0)
PLATELETS: 204 10*3/uL (ref 150–400)
RBC: 3.6 MIL/uL — AB (ref 3.87–5.11)
RDW: 13.7 % (ref 11.5–15.5)
WBC: 7.6 10*3/uL (ref 4.0–10.5)

## 2015-10-18 MED ORDER — OXYCODONE-ACETAMINOPHEN 5-325 MG PO TABS: 1.0000 | ORAL_TABLET | ORAL | Status: DC | PRN

## 2015-10-18 MED ORDER — DOCUSATE SODIUM 100 MG PO CAPS: 100.0000 mg | ORAL_CAPSULE | Freq: Two times a day (BID) | ORAL | Status: DC

## 2015-10-18 MED ORDER — IBUPROFEN 600 MG PO TABS: 600.0000 mg | ORAL_TABLET | Freq: Four times a day (QID) | ORAL | Status: DC | PRN

## 2015-10-18 NOTE — Discharge Instructions (Signed)
Abdominal Hysterectomy, Care After Refer to this sheet in the next few weeks. These instructions provide you with information on caring for yourself after your procedure. Your health care provider may also give you more specific instructions. Your treatment has been planned according to current medical practices, but problems sometimes occur. Call your health care provider if you have any problems or questions after your procedure.  WHAT TO EXPECT AFTER THE PROCEDURE After your procedure, it is typical to have the following:  Pain.  Feeling tired.  Poor appetite.  Less interest in sex. It takes 6-8 weeks to recover from this surgery.  HOME CARE INSTRUCTIONS   Take pain medicines only as directed by your health care provider. Do not take over-the-counter pain medicines without checking with your health care provider first.  Change your bandage as directed by your health care provider.  Take showers instead of baths for 2-3 weeks. Ask your health care provider when it is safe to start showering.  Do not douche, use tampons, or have sexual intercourse for at least 8 weeks or until your health care provider says you can.   Follow your health care provider's advice about exercise, lifting, driving, and general activities.  Get plenty of rest and sleep.   Do not lift anything heavier than a gallon of milk (about 10 lb [4.5 kg]) for the first month after surgery.  You can resume your normal diet if your health care provider says it is okay.   Do not drink alcohol until your health care provider says you can.   If you are constipated, ask your health care provider if you can take a mild laxative.  Eating foods high in fiber may also help with constipation. Eat plenty of raw fruits and vegetables, whole grains, and beans.  Drink enough fluids to keep your urine clear or pale yellow.   Try to have someone at home with you for the first 1-2 weeks to help around the house.  Keep  all follow-up appointments. SEEK MEDICAL CARE IF:   You have chills or fever.  You have swelling, redness, or pain in the area of your incision that is getting worse.   You have pus coming from the incision.   You notice a bad smell coming from the incision or bandage.   Your incision breaks open.   You feel dizzy or light-headed.   You have pain or bleeding when you urinate.   You have persistent diarrhea.   You have persistent nausea and vomiting.   You have abnormal vaginal discharge.   You have a rash.   You have any type of abnormal reaction or develop an allergy to your medicine.   Your pain medicine is not helping.  SEEK IMMEDIATE MEDICAL CARE IF:   You have a fever and your symptoms suddenly get worse.  You have severe abdominal pain.  You have chest pain.  You have shortness of breath.  You faint.  You have pain, swelling, or redness of your leg.  You have heavy vaginal bleeding with blood clots. MAKE SURE YOU:  Understand these instructions.  Will watch your condition.  Will get help right away if you are not doing well or get worse.   This information is not intended to replace advice given to you by your health care provider. Make sure you discuss any questions you have with your health care provider.   Document Released: 03/22/2005 Document Revised: 09/23/2014 Document Reviewed: 06/25/2013 Elsevier Interactive Patient Education 2016  Elsevier Inc. ° °

## 2015-10-18 NOTE — Progress Notes (Signed)
1 Day Post-Op Procedure(s) (LRB): HYSTERECTOMY ABDOMINAL WITH SALPINGECTOMY (Bilateral)  Subjective: Patient reports incisional pain, tolerating PO and no problems voiding.  No flatus yet.  Objective: I have reviewed patient's vital signs, intake and output, medications and labs. Filed Vitals:   10/17/15 2154 10/18/15 0209 10/18/15 0213 10/18/15 0521  BP: 124/64 130/75  137/75  Pulse: 62 64  66  Temp: 98.4 F (36.9 C) 98.4 F (36.9 C)  99 F (37.2 C)  TempSrc: Oral Oral  Oral  Resp: 14 14 14 14   Height:      Weight:      SpO2: 100% 99% 100% 99%    General: alert and no distress Resp: clear to auscultation bilaterally Cardio: regular rate and rhythm GI: soft, non-tender; bowel sounds normal; no masses,  no organomegaly and incision: bloody drainage present Extremities: extremities normal, atraumatic, no cyanosis or edema and Homans sign is negative, no sign of DVT Vaginal Bleeding: minimal   CBC Latest Ref Rng 10/18/2015 10/02/2015 08/14/2015  WBC 4.0 - 10.5 K/uL 7.6 4.2 3.5(L)  Hemoglobin 12.0 - 15.0 g/dL 10.1(L) 11.8(L) 11.7(L)  Hematocrit 36.0 - 46.0 % 30.9(L) 36.2 35.6(L)  Platelets 150 - 400 K/uL 204 271 266    CMP Latest Ref Rng 10/18/2015 12/27/2010 10/11/2008  Glucose 65 - 99 mg/dL 114(H) 88 93  BUN 6 - 20 mg/dL 9 16 12   Creatinine 0.44 - 1.00 mg/dL 0.65 0.9 0.8  Sodium 135 - 145 mmol/L 135 140 142  Potassium 3.5 - 5.1 mmol/L 3.6 4.2 4.0  Chloride 101 - 111 mmol/L 105 108 110  CO2 22 - 32 mmol/L 23 - -  Calcium 8.9 - 10.3 mg/dL 8.6(L) - -  Total Protein 6.5 - 8.1 g/dL 7.0 - -  Total Bilirubin 0.3 - 1.2 mg/dL 1.1 - -  Alkaline Phos 38 - 126 U/L 54 - -  AST 15 - 41 U/L 23 - -  ALT 14 - 54 U/L 15 - -     Assessment: s/p Procedure(s): HYSTERECTOMY ABDOMINAL WITH SALPINGECTOMY (Bilateral): stable, progressing well and tolerating diet  Plan: Advance diet Encourage ambulation Advance to PO medication Discontinue IV fluids Discharge home tomorrow Change  incisional dressing   LOS: 1 day    Selena Smith A,MD 10/18/2015, 7:26 AM

## 2015-10-18 NOTE — Anesthesia Postprocedure Evaluation (Signed)
Anesthesia Post Note  Patient: Selena Smith  Procedure(s) Performed: Procedure(s) (LRB): HYSTERECTOMY ABDOMINAL WITH SALPINGECTOMY (Bilateral)  Patient location during evaluation: [facility] Anesthesia Type: General Level of consciousness: awake, awake and alert, oriented and patient cooperative Pain management: pain level controlled Vital Signs Assessment: post-procedure vital signs reviewed and stable Respiratory status: spontaneous breathing, nonlabored ventilation and respiratory function stable Cardiovascular status: stable Postop Assessment: no signs of nausea or vomiting Anesthetic complications: no    Last Vitals:  Filed Vitals:   10/18/15 0213 10/18/15 0521  BP:  137/75  Pulse:  66  Temp:  37.2 C  Resp: 14 14    Last Pain:  Filed Vitals:   10/18/15 0730  PainSc: 5                  Adylynn Hertenstein L

## 2015-10-18 NOTE — Addendum Note (Signed)
Addendum  created 10/18/15 0751 by Raenette Rover, CRNA   Modules edited: Clinical Notes   Clinical Notes:  File: [id number]

## 2015-10-19 NOTE — Discharge Summary (Signed)
Gynecology Physician Postoperative Discharge Summary  Patient ID: Selena Smith MRN: [id number] DOB/AGE: 01/22/1976 40 y.o.  Admit Date: 10/17/2015 Discharge Date: 10/19/2015  Preoperative Diagnoses: Symptomatic fibroids, abnormal uterine bleeding  Procedures: HYSTERECTOMY ABDOMINAL WITH SALPINGECTOMY (Bilateral)  Hospital Course:  Selena Smith is a 40 y.o. [id number]  admitted for scheduled surgery.  She underwent the procedures as mentioned above, her operation was uncomplicated. For further details about surgery, please refer to the operative report. Patient had an uncomplicated postoperative course. By time of discharge on POD#2, her pain was controlled on oral pain medications; she was ambulating, voiding without difficulty, tolerating regular diet and passing flatus. She was deemed stable for discharge to home.   Significant Labs: CBC Latest Ref Rng 10/18/2015 10/02/2015 08/14/2015  WBC 4.0 - 10.5 K/uL 7.6 4.2 3.5(L)  Hemoglobin 12.0 - 15.0 g/dL 10.1(L) 11.8(L) 11.7(L)  Hematocrit 36.0 - 46.0 % 30.9(L) 36.2 35.6(L)  Platelets 150 - 400 K/uL 204 271 266    Discharge Exam: Blood pressure 126/72, pulse 69, temperature 99 F (37.2 C), temperature source Oral, resp. rate 14, height 5\' 4"  (1.626 m), weight 149 lb (67.586 kg), SpO2 100 %. General appearance: alert and no distress  Resp: clear to auscultation bilaterally  Cardio: regular rate and rhythm  GI: soft, non-tender; bowel sounds normal; no masses, no organomegaly.  Incision: C/D/I, no erythema, no drainage noted Pelvic: scant blood on pad  Extremities: extremities normal, atraumatic, no cyanosis or edema and Homans sign is negative, no sign of DVT  Discharged Condition: Stable  Disposition: 01-Home or Self Care     Medication List    TAKE these medications        docusate sodium 100 MG capsule  Commonly known as:  COLACE  Take 1 capsule (100 mg total) by mouth 2 (two) times daily.     ibuprofen 600 MG tablet  Commonly  known as:  ADVIL,MOTRIN  Take 1 tablet (600 mg total) by mouth every 6 (six) hours as needed (mild pain).     oxyCODONE-acetaminophen 5-325 MG tablet  Commonly known as:  PERCOCET/ROXICET  Take 1-2 tablets by mouth every 4 (four) hours as needed for severe pain (moderate to severe pain (when tolerating fluids)).           Follow-up Information    Follow up with [facility] On 11/15/2015.   Why:  1:15 pm for postoperative appointment. Call clinic/come to [facility]U for any concerning issues.   Contact information:   [place] [phone]      Signed:  Verita Schneiders, MD, [place] Attending [place], [facility]

## 2015-10-31 ENCOUNTER — Other Ambulatory Visit: Payer: Self-pay | Admitting: Obstetrics & Gynecology

## 2015-11-15 ENCOUNTER — Encounter: Payer: Self-pay | Admitting: Obstetrics & Gynecology

## 2015-11-15 ENCOUNTER — Ambulatory Visit (INDEPENDENT_AMBULATORY_CARE_PROVIDER_SITE_OTHER): Payer: 59 | Admitting: Obstetrics & Gynecology

## 2015-11-15 VITALS — BP 143/93 | HR 80 | Temp 98.6°F | Ht 65.0 in | Wt 139.7 lb

## 2015-11-15 DIAGNOSIS — Z9889 Other specified postprocedural states: Secondary | ICD-10-CM

## 2015-11-15 DIAGNOSIS — N9089 Other specified noninflammatory disorders of vulva and perineum: Secondary | ICD-10-CM

## 2015-11-15 DIAGNOSIS — N898 Other specified noninflammatory disorders of vagina: Secondary | ICD-10-CM

## 2015-11-15 MED ORDER — METRONIDAZOLE 500 MG PO TABS: 500.0000 mg | ORAL_TABLET | Freq: Two times a day (BID) | ORAL | Status: DC

## 2015-11-15 NOTE — Progress Notes (Signed)
   Subjective:    Patient ID: Selena Smith, female    DOB: 1975/11/30, 40 y.o.   MRN: [id number]  HPI  30 AA P1 is here 6 weeks post op s/p TAH/BS for symptomatic fibroids. She has done well postoperatively, with normal bowel and bladder function.   She has a week h/o 2 left labial lesions. She thinks that they may be from the panty liner she has been using.  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 If her wet prep shows something different, then I will treat accordingly. 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

## 2015-11-16 LAB — WET PREP, GENITAL
Trich, Wet Prep: NONE SEEN
Yeast Wet Prep HPF POC: NONE SEEN

## 2015-11-16 LAB — HSV 2 ANTIBODY, IGG: HSV 2 Glycoprotein G Ab, IgG: 8.42 IV — ABNORMAL HIGH

## 2015-11-17 ENCOUNTER — Other Ambulatory Visit: Payer: Self-pay | Admitting: Obstetrics & Gynecology

## 2015-11-22 ENCOUNTER — Other Ambulatory Visit: Payer: Self-pay | Admitting: *Deleted

## 2015-11-22 ENCOUNTER — Telehealth: Payer: Self-pay | Admitting: *Deleted

## 2015-11-22 DIAGNOSIS — N760 Acute vaginitis: Principal | ICD-10-CM

## 2015-11-22 DIAGNOSIS — B9689 Other specified bacterial agents as the cause of diseases classified elsewhere: Secondary | ICD-10-CM

## 2015-11-22 MED ORDER — METRONIDAZOLE 500 MG PO TABS
500.0000 mg | ORAL_TABLET | Freq: Two times a day (BID) | ORAL | Status: DC
Start: 2015-11-22 — End: 2016-02-03

## 2015-11-22 NOTE — Telephone Encounter (Signed)
She can.  Please provide a work letter for her if she desires. Thank you.

## 2015-11-22 NOTE — Telephone Encounter (Signed)
Called patient back at requested number and heard message that voicemail box is full. Will try to reach her again tomorrow.

## 2015-11-22 NOTE — Telephone Encounter (Signed)
RF request from [facility] for Flagyl OK'D per VO Dr Hulan Fray.

## 2015-11-22 NOTE — Telephone Encounter (Signed)
Pt called and stated that she would like to return to work following her surgery. Asked that we return her call and leave a message letting her know. Message to Dr. Harolyn Rutherford.

## 2015-11-27 NOTE — Telephone Encounter (Signed)
Selena Smith called and left a message 11/24/15 evening stating she had a hysterctomy 10/20/15 and had her followup appointment 11/15/15 .  States she has been sick the last 2 weeks coughing and had been holding her stomach when she coughs. States she was having a dark discharge, now is light pink and that ibuprofen not helping.

## 2015-11-28 ENCOUNTER — Encounter: Payer: Self-pay | Admitting: Obstetrics & Gynecology

## 2015-11-28 ENCOUNTER — Other Ambulatory Visit: Payer: Self-pay

## 2015-11-28 ENCOUNTER — Telehealth: Payer: Self-pay | Admitting: Obstetrics & Gynecology

## 2015-11-28 ENCOUNTER — Telehealth: Payer: Self-pay

## 2015-11-28 NOTE — Telephone Encounter (Signed)
Selena Smith called and left a message stating she called yesterday. Message was hard to understand but patient discussing her having hysterectomy, having pink discharge, having a cough and holding stomach when she coughed. States she spoke to a nurse last night who told her to call us today.   Called [name] and left a message  i am returning her call, and could not understand all of her message- please call clinic back before 4pm or in am.

## 2015-11-28 NOTE — Telephone Encounter (Signed)
Please right this patient a letter stating she can returned to work she was seen on 11/15/2015 for her post op.  Please and Thank You!

## 2015-11-28 NOTE — Telephone Encounter (Signed)
Patient want a Note stating she can return to work

## 2015-11-29 ENCOUNTER — Telehealth: Payer: Self-pay | Admitting: General Practice

## 2015-11-29 DIAGNOSIS — B009 Herpesviral infection, unspecified: Secondary | ICD-10-CM

## 2015-11-29 MED ORDER — VALACYCLOVIR HCL 1 G PO TABS: 1000.0000 mg | ORAL_TABLET | Freq: Every day | ORAL | Status: DC

## 2015-11-29 NOTE — Telephone Encounter (Signed)
Spoke with patient who was complaining of having some pink discharge. The discharge has now clear up.

## 2015-11-29 NOTE — Telephone Encounter (Signed)
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 Needs Rx for valtrex 1gram daily x 5 days, patient may have 6 refills. Called patient and informed her of results & medication sent to pharmacy. Patient verbalized understanding & asked about letter for her job. Told patient we will have that letter waiting up front for her. Patient verbalized understanding and had no other questions

## 2016-02-03 ENCOUNTER — Inpatient Hospital Stay (HOSPITAL_COMMUNITY)
Admission: AD | Admit: 2016-02-03 | Discharge: 2016-02-03 | Disposition: A | Discharge status: Home / Self Care | Payer: 59 | Source: Ambulatory Visit | Attending: Obstetrics & Gynecology | Admitting: Obstetrics & Gynecology

## 2016-02-03 ENCOUNTER — Encounter (HOSPITAL_COMMUNITY): Payer: Self-pay

## 2016-02-03 DIAGNOSIS — R1031 Right lower quadrant pain: Secondary | ICD-10-CM | POA: Diagnosis not present

## 2016-02-03 DIAGNOSIS — R102 Pelvic and perineal pain: Secondary | ICD-10-CM | POA: Diagnosis not present

## 2016-02-03 DIAGNOSIS — Z3202 Encounter for pregnancy test, result negative: Secondary | ICD-10-CM | POA: Diagnosis not present

## 2016-02-03 DIAGNOSIS — F17210 Nicotine dependence, cigarettes, uncomplicated: Secondary | ICD-10-CM | POA: Diagnosis not present

## 2016-02-03 LAB — CBC
HCT: 34.9 % — ABNORMAL LOW (ref 36.0–46.0)
Hemoglobin: 11.6 g/dL — ABNORMAL LOW (ref 12.0–15.0)
MCH: 28.1 pg (ref 26.0–34.0)
MCHC: 33.2 g/dL (ref 30.0–36.0)
MCV: 84.5 fL (ref 78.0–100.0)
Platelets: 269 10*3/uL (ref 150–400)
RBC: 4.13 MIL/uL (ref 3.87–5.11)
RDW: 14 % (ref 11.5–15.5)
WBC: 6 10*3/uL (ref 4.0–10.5)

## 2016-02-03 LAB — URINALYSIS, ROUTINE W REFLEX MICROSCOPIC
Bilirubin Urine: NEGATIVE
GLUCOSE, UA: NEGATIVE mg/dL
Hgb urine dipstick: NEGATIVE
Ketones, ur: NEGATIVE mg/dL
LEUKOCYTES UA: NEGATIVE
Nitrite: NEGATIVE
PROTEIN: NEGATIVE mg/dL
Specific Gravity, Urine: 1.02 (ref 1.005–1.030)
pH: 6 (ref 5.0–8.0)

## 2016-02-03 LAB — POCT PREGNANCY, URINE: PREG TEST UR: NEGATIVE

## 2016-02-03 NOTE — MAU Note (Addendum)
Pt c/o pain in her right side since Sunday. Pt c/o increased sweating that is more than normal. Pt denies bleeding. Pt had a hysterectomy at the end of January. Pt also c/o decreased appetite and weight loss since her hysterectomy.

## 2016-02-03 NOTE — MAU Provider Note (Signed)
History     CSN: [id number]  Arrival date and time: 02/03/16 1411   First Provider Initiated Contact with Patient 02/03/16 1514      Chief Complaint  Patient presents with  . Abdominal Pain   HPI  Selena Smith is a 40 y.o. G2P0011 s/p TAH, BO in Jan for AUB due to fibroids. She presents with RLQ pain off/on x 1 wk. It is crampy in nature, lasts ~ 4 min, occurs every few hrs. She had sweating 5/17 and 18 nights- woke up with damp sheets and nightclothes.  She did not take her temp. She denies changes in discharge, odor or itching.No urinary frequency, urgency or dysuria. No GI changes, nl BM 2 d ago. Same partner x 5 yr.   OB History    Gravida Para Term Preterm AB TAB SAB Ectopic Multiple Living   2    1  1   1       Past Medical History  Diagnosis Date  . Medical history non-contributory     Past Surgical History  Procedure Laterality Date  . Abdominal hysterectomy  10/17/2015    And Bilateral Salpingectomy. For fibroids, AUB.    Family History  Problem Relation Age of Onset  . Hypertension Neg Hx   . Diabetes Neg Hx   . Cancer Neg Hx     Social History  Substance Use Topics  . Smoking status: Current Some Day Smoker -- 0.25 packs/day    Types: Cigarettes  . Smokeless tobacco: None  . Alcohol Use: No    Allergies: No Known Allergies  Prescriptions prior to admission  Medication Sig Dispense Refill Last Dose  . ibuprofen (ADVIL,MOTRIN) 600 MG tablet TAKE 1 TABLET BY MOUTH EVERY 6 HOURS AS NEEDED FOR MILD PAIN 60 tablet 0 02/03/2016 at Unknown time  . docusate sodium (COLACE) 100 MG capsule TAKE ONE CAPSULE BY MOUTH TWICE A DAY (Patient not taking: Reported on 02/03/2016) 10 capsule 0   . metroNIDAZOLE (FLAGYL) 500 MG tablet Take 1 tablet (500 mg total) by mouth 2 (two) times daily. (Patient not taking: Reported on 02/03/2016) 14 tablet 0   . oxyCODONE-acetaminophen (PERCOCET/ROXICET) 5-325 MG tablet Take 1-2 tablets by mouth every 4 (four) hours as needed for  severe pain (moderate to severe pain (when tolerating fluids)). (Patient not taking: Reported on 11/15/2015) 60 tablet 0 Not Taking  . valACYclovir (VALTREX) 1000 MG tablet Take 1 tablet (1,000 mg total) by mouth daily. (Patient not taking: Reported on 02/03/2016) 5 tablet 6     Review of Systems  Constitutional: Positive for fever, chills and diaphoresis.  Gastrointestinal: Positive for abdominal pain. Negative for nausea, vomiting, diarrhea and constipation.  Genitourinary: Negative for dysuria, urgency and frequency.   Physical Exam   Blood pressure 138/84, pulse 84, temperature 98.3 F (36.8 C), temperature source Oral, resp. rate 18, height 5\' 1"  (1.549 m), weight 135 lb (61.236 kg).  Physical Exam  Nursing note and vitals reviewed. Constitutional: She is oriented to person, place, and time. She appears well-developed and well-nourished. No distress.  GI: Soft. Bowel sounds are normal. She exhibits no distension and no mass. There is no tenderness. There is no rebound and no guarding.  Genitourinary:  Pelvic exam: Ext gen- nl anatomy, skin intact Vagina- mod amt creamy white discharge Cx- absent, sl tender cuff, intact, no erythema or edema Uterus- absent Adn- non tender, no masses palp   Musculoskeletal: Normal range of motion.  Neurological: She is alert and oriented to person,  place, and time.  Skin: Skin is warm and dry.  Psychiatric: She has a normal mood and affect. Her behavior is normal.    MAU Course  Procedures  MDM Results for orders placed or performed during the hospital encounter of 02/03/16 (from the past 24 hour(s))  Urinalysis, Routine w reflex microscopic (not at [facility])     Status: Abnormal   Collection Time: 02/03/16  2:20 PM  Result Value Ref Range   Color, Urine YELLOW YELLOW   APPearance HAZY (A) CLEAR   Specific Gravity, Urine 1.020 1.005 - 1.030   pH 6.0 5.0 - 8.0   Glucose, UA NEGATIVE NEGATIVE mg/dL   Hgb urine dipstick NEGATIVE NEGATIVE    Bilirubin Urine NEGATIVE NEGATIVE   Ketones, ur NEGATIVE NEGATIVE mg/dL   Protein, ur NEGATIVE NEGATIVE mg/dL   Nitrite NEGATIVE NEGATIVE   Leukocytes, UA NEGATIVE NEGATIVE  Pregnancy, urine POC     Status: None   Collection Time: 02/03/16  2:29 PM  Result Value Ref Range   Preg Test, Ur NEGATIVE NEGATIVE  CBC     Status: Abnormal   Collection Time: 02/03/16  3:33 PM  Result Value Ref Range   WBC 6.0 4.0 - 10.5 K/uL   RBC 4.13 3.87 - 5.11 MIL/uL   Hemoglobin 11.6 (L) 12.0 - 15.0 g/dL   HCT 34.9 (L) 36.0 - 46.0 %   MCV 84.5 78.0 - 100.0 fL   MCH 28.1 26.0 - 34.0 pg   MCHC 33.2 30.0 - 36.0 g/dL   RDW 14.0 11.5 - 15.5 %   Platelets 269 150 - 400 K/uL     Assessment and Plan  UA, CBC neg Advil/Aleve for discomfort [facility]/U [facility]- pt to call for an appt Call the clinic with fever or increased symptoms  [name] M. 02/03/2016, 3:55 PM

## 2016-03-04 ENCOUNTER — Ambulatory Visit: Payer: 59 | Admitting: Obstetrics & Gynecology

## 2017-03-03 ENCOUNTER — Other Ambulatory Visit: Payer: Self-pay | Admitting: Obstetrics and Gynecology

## 2017-03-03 DIAGNOSIS — N6310 Unspecified lump in the right breast, unspecified quadrant: Secondary | ICD-10-CM

## 2017-03-05 ENCOUNTER — Ambulatory Visit
Admission: RE | Admit: 2017-03-05 | Discharge: 2017-03-05 | Disposition: A | Discharge status: Home / Self Care | Payer: 59 | Source: Ambulatory Visit | Attending: Obstetrics and Gynecology | Admitting: Obstetrics and Gynecology

## 2017-03-05 ENCOUNTER — Other Ambulatory Visit: Payer: Self-pay | Admitting: Obstetrics and Gynecology

## 2017-03-05 DIAGNOSIS — N6320 Unspecified lump in the left breast, unspecified quadrant: Secondary | ICD-10-CM

## 2017-03-05 DIAGNOSIS — N6310 Unspecified lump in the right breast, unspecified quadrant: Secondary | ICD-10-CM

## 2017-05-06 ENCOUNTER — Ambulatory Visit (HOSPITAL_COMMUNITY)
Admission: EM | Admit: 2017-05-06 | Discharge: 2017-05-06 | Disposition: A | Discharge status: Home / Self Care | Payer: 59 | Attending: Family Medicine | Admitting: Family Medicine

## 2017-05-06 ENCOUNTER — Encounter (HOSPITAL_COMMUNITY): Payer: Self-pay | Admitting: Emergency Medicine

## 2017-05-06 DIAGNOSIS — M722 Plantar fascial fibromatosis: Secondary | ICD-10-CM

## 2017-05-06 DIAGNOSIS — M7750 Other enthesopathy of unspecified foot: Secondary | ICD-10-CM

## 2017-05-06 MED ORDER — MELOXICAM 7.5 MG PO TABS: 7.5000 mg | ORAL_TABLET | Freq: Every day | ORAL | 0 refills | Status: AC

## 2017-05-06 NOTE — Discharge Instructions (Signed)
Take Mobic as directed. Wear supportive shoes. Follow exercises on attached resources. Follow-up with PCP for further evaluation and treatment needed. Monitor for any worsening of symptoms, numbness, tingling, swelling of the foot, follow-up for reevaluation.

## 2017-05-06 NOTE — ED Triage Notes (Signed)
PT reports pain in left foot underneath arch for 1 month. No injury.

## 2017-05-06 NOTE — ED Provider Notes (Signed)
[facility]    CSN: [id number] Arrival date & time: 05/06/17  1100     History   Chief Complaint Chief Complaint  Patient presents with  . Foot Pain    HPI Selena Smith is a 41 y.o. female.   41 year old female comes in for 1 month history of left foot pain. She did describes the pain as the bottom of her foot, pain worse first thing in the morning, after rest, and with activity. Her work requires a lot of walking and standing. Denies other strenuous activity. Denies injury, numbness, tingling, swelling of the foot. She has been taking aspirin and using topical pain relief in with some relief. She does wear well supported shoes, with insoles. Denies diabetes.      Past Medical History:  Diagnosis Date  . Medical history non-contributory     Patient Active Problem List   Diagnosis Date Noted  . S/P total hysterectomy with removal of both tubes 10/17/2015    Past Surgical History:  Procedure Laterality Date  . ABDOMINAL HYSTERECTOMY  10/17/2015   And Bilateral Salpingectomy. For fibroids, AUB.    OB History    Gravida Para Term Preterm AB Living   2       1 1    SAB TAB Ectopic Multiple Live Births   1               Home Medications    Prior to Admission medications   Medication Sig Start Date End Date Taking? Authorizing Provider  docusate sodium (COLACE) 100 MG capsule TAKE ONE CAPSULE BY MOUTH TWICE A DAY Patient not taking: Reported on 02/03/2016 11/17/15   Anyanwu, Sallyanne Havers, MD  ibuprofen (ADVIL,MOTRIN) 600 MG tablet TAKE 1 TABLET BY MOUTH EVERY 6 HOURS AS NEEDED FOR MILD PAIN 11/17/15   Anyanwu, Sallyanne Havers, MD  meloxicam (MOBIC) 7.5 MG tablet Take 1 tablet (7.5 mg total) by mouth daily. 05/06/17 05/21/17  Ok Edwards, PA-C    Family History Family History  Problem Relation Age of Onset  . Hypertension Neg Hx   . Diabetes Neg Hx   . Cancer Neg Hx     Social History Social History  Substance Use Topics  . Smoking status: Current Some Day Smoker      Packs/day: 0.25    Types: Cigarettes  . Smokeless tobacco: Not on file  . Alcohol use No     Allergies   Patient has no known allergies.   Review of Systems Review of Systems  Reason unable to perform ROS: See HPI as above.     Physical Exam Triage Vital Signs ED Triage Vitals  Enc Vitals Group     BP 05/06/17 1157 112/78     Pulse Rate 05/06/17 1157 (!) 56     Resp 05/06/17 1157 16     Temp 05/06/17 1157 97.8 F (36.6 C)     Temp Source 05/06/17 1157 Oral     SpO2 05/06/17 1157 100 %     Weight 05/06/17 1156 147 lb (66.7 kg)     Height 05/06/17 1156 5\' 1"  (1.549 m)     Head Circumference --      Peak Flow --      Pain Score 05/06/17 1156 8     Pain Loc --      Pain Edu? --      Excl. in [place]? --    No data found.   Updated Vital Signs BP 112/78 (BP Location: Right  Arm)   Pulse (!) 56   Temp 97.8 F (36.6 C) (Oral)   Resp 16   Ht 5\' 1"  (1.549 m)   Wt 147 lb (66.7 kg)   LMP 09/24/2015 (Approximate)   SpO2 100%   BMI 27.78 kg/m    Physical Exam  Constitutional: She is oriented to person, place, and time. She appears well-developed and well-nourished. No distress.  Musculoskeletal:  No tenderness on palpation of the ankles. Tenderness on palpation of the left heel, plantar arch, especially with dorsiflexion of the toes. Full range of motion of the ankle and toes. Strength normal and equal bilaterally. Sensation intact and equal bilaterally. Pedal pulses 2+ and equal. Capillary refill unable to assess due to nail polish.  Neurological: She is alert and oriented to person, place, and time.     UC Treatments / Results  Labs (all labs ordered are listed, but only abnormal results are displayed) Labs Reviewed - No data to display  EKG  EKG Interpretation None       Radiology No results found.  Procedures Procedures (including critical care time)  Medications Ordered in UC Medications - No data to display   Initial Impression / Assessment  and Plan / UC Course  I have reviewed the triage vital signs and the nursing notes.  Pertinent labs & imaging results that were available during my care of the patient were reviewed by me and considered in my medical decision making (see chart for details).    Discussed with patient history and exam is consistent with plantar fasciitis. Start Mobic as directed. Exercises and resources for plantar fasciitis is given to patient. Patient to refrain from walking bare footed, and to wear supportive shoes. Patient to follow-up with PCP for further evaluation and treatment, as well as referral as needed. Return precautions given.  Final Clinical Impressions(s) / UC Diagnoses   Final diagnoses:  Tendinitis of ankle or foot  Plantar fasciitis of left foot    New Prescriptions New Prescriptions   MELOXICAM (MOBIC) 7.5 MG TABLET    Take 1 tablet (7.5 mg total) by mouth daily.       Ok Edwards, PA-C 05/06/17 1304

## 2017-06-11 ENCOUNTER — Ambulatory Visit (INDEPENDENT_AMBULATORY_CARE_PROVIDER_SITE_OTHER): Payer: 59

## 2017-06-11 ENCOUNTER — Encounter: Payer: Self-pay | Admitting: Podiatry

## 2017-06-11 ENCOUNTER — Ambulatory Visit (INDEPENDENT_AMBULATORY_CARE_PROVIDER_SITE_OTHER): Payer: 59 | Admitting: Podiatry

## 2017-06-11 DIAGNOSIS — M216X2 Other acquired deformities of left foot: Secondary | ICD-10-CM

## 2017-06-11 DIAGNOSIS — M722 Plantar fascial fibromatosis: Secondary | ICD-10-CM

## 2017-06-11 MED ORDER — TRIAMCINOLONE ACETONIDE 10 MG/ML IJ SUSP
5.0000 mg | Freq: Once | INTRAMUSCULAR | Status: AC
  Administered 2017-06-11: 5 mg

## 2017-06-11 MED ORDER — DEXAMETHASONE SODIUM PHOSPHATE 120 MG/30ML IJ SOLN
4.0000 mg | Freq: Once | INTRAMUSCULAR | Status: AC
  Administered 2017-06-11: 4 mg via INTRAMUSCULAR

## 2017-06-11 MED ORDER — MELOXICAM 15 MG PO TABS: 15.0000 mg | ORAL_TABLET | Freq: Every day | ORAL | 0 refills | Status: DC

## 2017-06-11 NOTE — Progress Notes (Signed)
   Subjective:    Patient ID: Selena Smith, female    DOB: Feb 05, 1976, 41 y.o.   MRN: [id number]  HPI   Chief Complaint  Patient presents with  . Plantar Fasciitis    i have some left heel pain and hurts on the back    41 y.o. female presents with the above complaint. Reports pain to the left heel x 4 months. Went to the ER 2 weeks ago. Pain worse in the AM but hurts all the time. Endorses burning and tingling, occasional numbness of her heel. Denies swelling. Denies other issues.  Past Medical History:  Diagnosis Date  . Medical history non-contributory    Past Surgical History:  Procedure Laterality Date  . ABDOMINAL HYSTERECTOMY  10/17/2015   And Bilateral Salpingectomy. For fibroids, AUB.    Current Outpatient Prescriptions:  .  docusate sodium (COLACE) 100 MG capsule, TAKE ONE CAPSULE BY MOUTH TWICE A DAY (Patient not taking: Reported on 02/03/2016), Disp: 10 capsule, Rfl: 0 .  ibuprofen (ADVIL,MOTRIN) 600 MG tablet, TAKE 1 TABLET BY MOUTH EVERY 6 HOURS AS NEEDED FOR MILD PAIN, Disp: 60 tablet, Rfl: 0 .  meloxicam (MOBIC) 15 MG tablet, Take 1 tablet (15 mg total) by mouth daily., Disp: 30 tablet, Rfl: 0  No Known Allergies   Review of Systems  All other systems reviewed and are negative.      Objective:   Physical Exam There were no vitals filed for this visit. General AA&O x3. Normal mood and affect.  Vascular Dorsalis pedis and posterior tibial pulses  present 2+ bilaterally  Capillary refill normal to all digits. Pedal hair growth normal.  Neurologic Epicritic sensation grossly present bilaterally.  Dermatologic No open lesions. Interspaces clear of maceration. Nails well groomed and normal in appearance.  Orthopedic: MMT 5/5 in dorsiflexion, plantarflexion, inversion, and eversion bilaterally. Tender to palpation at the calcaneal tuber left. No pain with calcaneal squeeze left. Ankle ROM diminished range of motion left. Silfverskiold Test: positive left.    Radiographs: Taken and reviewed. No acute fractures. No evidence of calcaneal stress fracture. Small plantar and posterior calcaneal spurring.    Assessment & Plan:  Patient was evaluated and treated and all questions answered.  Plantar Fasciitis, left - XR reviewed as above.  - Educated on icing and stretching. Instructions given.  - Injection delivered as below. - Night splint dispensed. Medically necessary for stretching of the plantar fascia. - eRx Meloxicam - Plantar fascial strapping performed as patient is headed on a trip to [place] and will be walking a lot. Advised she can shower normally and gently dry the strapping. Advised to remove the strapping after 5 days.  Procedure: Injection Tendon/Ligament Location: Left plantar fascia at the glabrous junction; medial approach. Skin Prep: Alcohol. Injectate: 1 cc 0.5% marcaine plain, 1 cc dexamethasone phosphate, 0.5 cc kenalog 10. Disposition: Patient tolerated procedure well. Injection site dressed with a band-aid.

## 2017-07-09 ENCOUNTER — Ambulatory Visit (INDEPENDENT_AMBULATORY_CARE_PROVIDER_SITE_OTHER): Payer: 59 | Admitting: Podiatry

## 2017-07-09 DIAGNOSIS — M722 Plantar fascial fibromatosis: Secondary | ICD-10-CM

## 2017-07-09 NOTE — Patient Instructions (Signed)

## 2017-07-10 NOTE — Progress Notes (Signed)
  Subjective:  Patient ID: Dellie Catholic, female    DOB: 1976/07/05,  MRN: [id number]  Chief Complaint  Patient presents with  . Plantar Fasciitis    left foot   41 y.o. female returns for follow-up left heel pain.  States that she had no pain while she was on her trip. States her pain recently returned. Requests repeat injection today.   Objective:  There were no vitals filed for this visit. General AA&O x3. Normal mood and affect.  Vascular Pedal pulses palpable.  Neurologic Epicritic sensation grossly intact.  Dermatologic No open lesions. Skin normal texture and turgor.  Orthopedic: Pain to palpation left heel at the medial calcaneal tuber   Assessment & Plan:  Patient was evaluated and treated and all questions answered.  Plantar fasciitis L -Pain returned. Repeat injection given. -Plantar fascial rest strap reapplied. -Discussed benefits of CMOs. Will consider at next visit.  Procedure: Injection Tendon/Ligament Location: Left plantar fascia at the glabrous junction; medial approach. Skin Prep: Alcohol. Injectate: 1 cc 0.5% marcaine plain, 1 cc dexamethasone phosphate, 0.5 cc kenalog 10. Disposition: Patient tolerated procedure well. Injection site dressed with a band-aid.    Return in about 4 weeks (around 08/06/2017) for plantar fasciitis.

## 2017-07-18 ENCOUNTER — Telehealth: Payer: Self-pay

## 2017-07-18 NOTE — Telephone Encounter (Signed)
Patient requesting refill on Meloxicam, has appt on 08/19/17

## 2017-07-20 MED ORDER — MELOXICAM 15 MG PO TABS: 15.0000 mg | ORAL_TABLET | Freq: Every day | ORAL | 0 refills | Status: DC

## 2017-07-21 ENCOUNTER — Other Ambulatory Visit: Payer: Self-pay

## 2017-07-21 NOTE — Progress Notes (Signed)
error 

## 2017-08-19 ENCOUNTER — Other Ambulatory Visit: Payer: Self-pay | Admitting: Podiatry

## 2017-08-21 ENCOUNTER — Ambulatory Visit: Payer: 59 | Admitting: Podiatry

## 2017-09-19 ENCOUNTER — Other Ambulatory Visit: Payer: 59

## 2017-09-19 ENCOUNTER — Inpatient Hospital Stay: Admission: RE | Admit: 2017-09-19 | Payer: 59 | Source: Ambulatory Visit

## 2017-10-16 ENCOUNTER — Other Ambulatory Visit: Payer: Self-pay | Admitting: Podiatry

## 2017-10-20 ENCOUNTER — Telehealth: Payer: Self-pay | Admitting: *Deleted

## 2017-10-20 NOTE — Telephone Encounter (Signed)
Request for refill  90 days of meloxicam. Dr. March Rummage states pt needs an appt prior to future refills. Return fax denying.

## 2017-12-22 ENCOUNTER — Telehealth: Payer: Self-pay | Admitting: *Deleted

## 2017-12-22 NOTE — Telephone Encounter (Signed)
Dr. March Rummage ordered TENS for intractable plantar fasciitis.

## 2019-03-30 ENCOUNTER — Other Ambulatory Visit: Payer: Self-pay | Admitting: *Deleted

## 2019-03-30 DIAGNOSIS — Z20822 Contact with and (suspected) exposure to covid-19: Secondary | ICD-10-CM

## 2019-04-04 LAB — NOVEL CORONAVIRUS, NAA: SARS-CoV-2, NAA: NOT DETECTED

## 2021-11-02 ENCOUNTER — Other Ambulatory Visit: Payer: Self-pay | Admitting: Obstetrics and Gynecology

## 2021-11-02 DIAGNOSIS — N63 Unspecified lump in unspecified breast: Secondary | ICD-10-CM

## 2021-11-28 ENCOUNTER — Ambulatory Visit
Admission: RE | Admit: 2021-11-28 | Discharge: 2021-11-28 | Disposition: A | Discharge status: Home / Self Care | Payer: 59 | Source: Ambulatory Visit | Attending: Obstetrics and Gynecology | Admitting: Obstetrics and Gynecology

## 2021-11-28 DIAGNOSIS — N63 Unspecified lump in unspecified breast: Secondary | ICD-10-CM

## 2022-05-11 ENCOUNTER — Emergency Department (HOSPITAL_COMMUNITY)
Admission: EM | Admit: 2022-05-11 | Discharge: 2022-05-12 | Disposition: A | Discharge status: Home / Self Care | Payer: 59 | Attending: Emergency Medicine | Admitting: Emergency Medicine

## 2022-05-11 ENCOUNTER — Emergency Department (HOSPITAL_COMMUNITY): Payer: 59

## 2022-05-11 ENCOUNTER — Other Ambulatory Visit: Payer: Self-pay | Admitting: Pharmacist

## 2022-05-11 ENCOUNTER — Encounter (HOSPITAL_COMMUNITY): Payer: Self-pay

## 2022-05-11 ENCOUNTER — Other Ambulatory Visit: Payer: Self-pay

## 2022-05-11 DIAGNOSIS — R0602 Shortness of breath: Secondary | ICD-10-CM | POA: Diagnosis not present

## 2022-05-11 DIAGNOSIS — M79601 Pain in right arm: Secondary | ICD-10-CM | POA: Insufficient documentation

## 2022-05-11 DIAGNOSIS — F17200 Nicotine dependence, unspecified, uncomplicated: Secondary | ICD-10-CM | POA: Insufficient documentation

## 2022-05-11 DIAGNOSIS — R0789 Other chest pain: Secondary | ICD-10-CM | POA: Insufficient documentation

## 2022-05-11 DIAGNOSIS — M79604 Pain in right leg: Secondary | ICD-10-CM | POA: Diagnosis present

## 2022-05-11 LAB — BASIC METABOLIC PANEL
Anion gap: 7 (ref 5–15)
BUN: 17 mg/dL (ref 6–20)
CO2: 26 mmol/L (ref 22–32)
Calcium: 8.7 mg/dL — ABNORMAL LOW (ref 8.9–10.3)
Chloride: 109 mmol/L (ref 98–111)
Creatinine, Ser: 0.86 mg/dL (ref 0.44–1.00)
GFR, Estimated: >60 mL/min (ref >60)
Glucose, Bld: 108 mg/dL — ABNORMAL HIGH (ref 70–99)
Potassium: 3.5 mmol/L (ref 3.5–5.1)
Sodium: 142 mmol/L (ref 135–145)

## 2022-05-11 LAB — CBC
HCT: 40 % (ref 36.0–46.0)
Hemoglobin: 12.9 g/dL (ref 12.0–15.0)
MCH: 29.1 pg (ref 26.0–34.0)
MCHC: 32.3 g/dL (ref 30.0–36.0)
MCV: 90.1 fL (ref 80.0–100.0)
Platelets: 292 10*3/uL (ref 150–400)
RBC: 4.44 MIL/uL (ref 3.87–5.11)
RDW: 13.7 % (ref 11.5–15.5)
WBC: 6 10*3/uL (ref 4.0–10.5)
nRBC: 0 % (ref 0.0–0.2)

## 2022-05-11 LAB — TROPONIN I (HIGH SENSITIVITY): Troponin I (High Sensitivity): <2 ng/L (ref <18)

## 2022-05-11 NOTE — ED Triage Notes (Signed)
Ambulatory to ED with c/o R sided leg pain x 3-4 months, thought it was just sleeping on it wrong. States she googled her symptoms and said "all I saw its blood clot and I got nervous". Now c/o R sided chest pain.

## 2022-05-11 NOTE — ED Provider Triage Note (Signed)
Emergency Medicine Provider Triage Evaluation Note  Selena Smith , a 46 y.o. female  was evaluated in triage.  Pt complains of right lower extremity pain with intermittent edema.  No injury.  No history of blood clots.  Patient also endorses right-sided chest pain.  Review of Systems  Positive: CP, leg pain Negative: SOB  Physical Exam  BP 126/84 (BP Location: Left Arm)   Pulse 85   Temp 98.4 F (36.9 C) (Oral)   Resp 16   Ht '5\' 4"'$  (1.626 m)   Wt 68 kg   LMP 09/24/2015 (Approximate)   SpO2 100%   BMI 25.75 kg/m  Gen:   Awake, no distress   Resp:  Normal effort  MSK:   Moves extremities without difficulty  Other:    Medical Decision Making  Medically screening exam initiated at 7:39 PM.  Appropriate orders placed.  Selena Smith was informed that the remainder of the evaluation will be completed by another provider, this initial triage assessment does not replace that evaluation, and the importance of remaining in the ED until their evaluation is complete.  [place] to rule out DVT Cardiac labs CXR EKG   Suzy Bouchard, [place] 05/11/22 1940

## 2022-05-11 NOTE — Chronic Care Management (AMB) (Signed)
Blood Pressure Screening Event.   Checked blood pressure today at community event. Patient denies questions or concerns.   Catie Hedwig Morton, PharmD, [facility] [phone]

## 2022-05-12 LAB — TROPONIN I (HIGH SENSITIVITY): Troponin I (High Sensitivity): <2 ng/L (ref <18)

## 2022-05-12 LAB — D-DIMER, QUANTITATIVE: D-Dimer, Quant: <0.27 ug/mL-FEU (ref 0.00–0.50)

## 2022-05-12 MED ORDER — CELECOXIB 200 MG PO CAPS: 200.0000 mg | ORAL_CAPSULE | Freq: Two times a day (BID) | ORAL | 0 refills | Status: DC

## 2022-05-12 MED ORDER — METHYLPREDNISOLONE 4 MG PO TBPK: ORAL_TABLET | ORAL | 0 refills | Status: DC

## 2022-05-12 NOTE — ED Provider Notes (Signed)
[facility] DEPT Provider Note   CSN: [id number] Arrival date & time: 05/11/22  1923     History  Chief Complaint  Patient presents with   Leg Pain   Chest Pain    Selena Smith is a 46 y.o. female who presents with cc of leg  and cp.Patient reports a hx of Chronic leg pain for the past 3 or 4 months.  She has pain in the back of her right thigh and down the right side of her calf.  She states that it is worse when she is sitting for long periods of time.  She has also had pain in her right upper arm and numbness and tingling.  It is worse at night and after gripping things for long periods of time.  Patient denies neck pain and has some intermittent low back pain.  Tonight she had onset of sharp chest pain.  Her chest pain is currently resolved.  She felt short of breath.  She states she was looking up symptoms on the Internet and feels like she might of "got myself a little worked up."  She was concerned she might have a blood clot.  She is a daily smoker.  She does not use any exogenous estrogens.  She denies swelling in the leg.  She denies any recent travel or confinement, history of PE or DVT      Leg Pain Chest Pain      Home Medications Prior to Admission medications   Medication Sig Start Date End Date Taking? Authorizing Provider  docusate sodium (COLACE) 100 MG capsule TAKE ONE CAPSULE BY MOUTH TWICE A DAY Patient not taking: Reported on 02/03/2016 11/17/15   Anyanwu, Sallyanne Havers, MD  ibuprofen (ADVIL,MOTRIN) 600 MG tablet TAKE 1 TABLET BY MOUTH EVERY 6 HOURS AS NEEDED FOR MILD PAIN 11/17/15   Anyanwu, Sallyanne Havers, MD  meloxicam (MOBIC) 15 MG tablet TAKE 1 TABLET (15 MG TOTAL) DAILY BY MOUTH. 10/17/17   Evelina Bucy, DPM      Allergies    Patient has no known allergies.    Review of Systems   Review of Systems  Cardiovascular:  Positive for chest pain.    Physical Exam Updated Vital Signs BP 132/87   Pulse 81   Temp 98.7 F (37.1 C)  (Oral)   Resp 14   Ht '5\' 4"'$  (1.626 m)   Wt 68 kg   LMP 09/24/2015 (Approximate)   SpO2 100%   BMI 25.75 kg/m  Physical Exam Vitals and nursing note reviewed.  Constitutional:      General: She is not in acute distress.    Appearance: She is well-developed. She is not diaphoretic.  HENT:     Head: Normocephalic and atraumatic.     Right Ear: External ear normal.     Left Ear: External ear normal.     Nose: Nose normal.     Mouth/Throat:     Mouth: Mucous membranes are moist.  Eyes:     General: No scleral icterus.    Conjunctiva/sclera: Conjunctivae normal.  Cardiovascular:     Rate and Rhythm: Normal rate and regular rhythm.     Heart sounds: Normal heart sounds. No murmur heard.    No friction rub. No gallop.  Pulmonary:     Effort: Pulmonary effort is normal. No respiratory distress.     Breath sounds: Normal breath sounds.  Abdominal:     General: Bowel sounds are normal. There is no distension.  Palpations: Abdomen is soft. There is no mass.     Tenderness: There is no abdominal tenderness. There is no guarding.  Musculoskeletal:     Cervical back: Normal range of motion.     Comments: Pos straight leg test R leg and Pos Tinnels sign R wrist  Skin:    General: Skin is warm and dry.  Neurological:     Mental Status: She is alert and oriented to person, place, and time.  Psychiatric:        Behavior: Behavior normal.     ED Results / Procedures / Treatments   Labs (all labs ordered are listed, but only abnormal results are displayed) Labs Reviewed  BASIC METABOLIC PANEL - Abnormal; Notable for the following components:      Result Value   Glucose, Bld 108 (*)    Calcium 8.7 (*)    All other components within normal limits  CBC  D-DIMER, QUANTITATIVE  TROPONIN I (HIGH SENSITIVITY)  TROPONIN I (HIGH SENSITIVITY)    EKG None  Radiology DG Chest 2 View  Result Date: 05/11/2022 CLINICAL DATA:  Right chest pain. Right leg pain for the past 3-4 months.  EXAM: CHEST - 2 VIEW COMPARISON:  12/27/2010 FINDINGS: Normal sized heart. Clear lungs with normal vascularity. Minimal thoracic spine degenerative changes. IMPRESSION: No active cardiopulmonary disease. Electronically Signed   By: Claudie Revering M.D.   On: 05/11/2022 19:49    Procedures Procedures    Medications Ordered in ED Medications - No data to display  ED Course/ Medical Decision Making/ A&P                           Medical Decision Making Given the large differential diagnosis for Selena Smith, the decision making in this case is of high complexity.  After evaluating all of the data points in this case, the presentation of Selena Smith is NOT consistent with Acute Coronary Syndrome (ACS) and/or myocardial ischemia, pulmonary embolism, aortic dissection; Borhaave's, significant arrythmia, pneumothorax, cardiac tamponade, or other emergent cardiopulmonary condition.  Further, the presentation of Selena Smith is NOT consistent with pericarditis, myocarditis, cholecystitis, pancreatitis, mediastinitis, endocarditis, new valvular disease.  Additionally, the presentation of Selena N Reidis NOT consistent with flail chest, cardiac contusion, ARDS, or significant intra-thoracic or intra-abdominal bleeding.  Moreover, this presentation is NOT consistent with pneumonia, sepsis, or pyelonephritis.     Strict return and follow-up precautions have been given by me personally or by detailed written instruction given verbally by nursing staff using the teach back method to the patient/family/caregiver(s).  Data Reviewed/Counseling: I have reviewed the patient's vital signs, nursing notes, and other relevant tests/information. I had a detailed discussion regarding the historical points, exam findings, and any diagnostic results supporting the discharge diagnosis. I also discussed the need for outpatient follow-up and the need to return to the ED if symptoms worsen or if there are any questions  or concerns that arise at home.    Amount and/or Complexity of Data Reviewed Labs: ordered. Radiology: ordered.  Risk Prescription drug management.           Final Clinical Impression(s) / ED Diagnoses Final diagnoses:  Right leg pain  Right arm pain  Atypical chest pain    Rx / DC Orders ED Discharge Orders     None         Margarita Mail, PA-C 05/13/22 0720    Palumbo, April, MD 05/17/22 1227

## 2022-05-12 NOTE — Discharge Instructions (Addendum)
It would be best served by following up with a primary care doctor regarding your arm and leg pain.  There is an 800-number in your discharge paperwork that you may call to help you get set up with a primary care doctor. Your work-up showed no evidence of a blood clot.  You do not need a scan of your leg.  I think that you have sciatica and I am discharging you with exercises and medication to treat this.  I am also concerned that you may have carpal tunnel syndrome.  You should use an over-the-counter Velcro wrist brace and wear it at all times except when showering.  Also ice the wrist several times a day. I will also give you referral to Dr. Percell Miller and you can have your arm pain and leg pain evaluated with orthopedics if you are unable to get a primary care doctor.   SEEK IMMEDIATE MEDICAL ATTENTION IF: You have severe chest pain, especially if the pain is crushing or pressure-like and spreads to the arms, back, neck, or jaw, or if you have sweating, nausea (feeling sick to your stomach), or shortness of breath. THIS IS AN EMERGENCY. Don't wait to see if the pain will go away. Get medical help at once. Call 911 or 0 (operator). DO NOT drive yourself to the hospital.  Your chest pain gets worse and does not go away with rest.  You have an attack of chest pain lasting longer than usual, despite rest and treatment with the medications your caregiver has prescribed.  You wake from sleep with chest pain or shortness of breath.  You feel dizzy or faint.  You have chest pain not typical of your usual pain for which you originally saw your caregiver.

## 2022-05-21 IMAGING — MG DIGITAL DIAGNOSTIC BILAT W/ TOMO W/ CAD
8 of 14 series · 8 of 40 positions shown · non-contrast
Comparison: Previous exam(s).

CLINICAL DATA: 45-year-old female presenting for delayed follow-up
of probably benign bilateral breast masses.



[R MLO synth-2D]
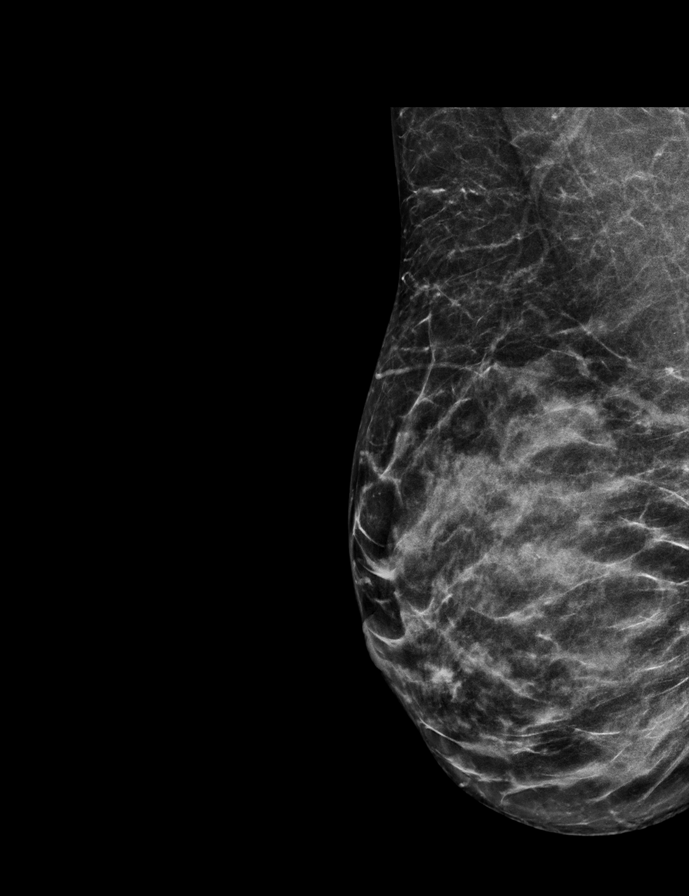

[R ML synth-2D]
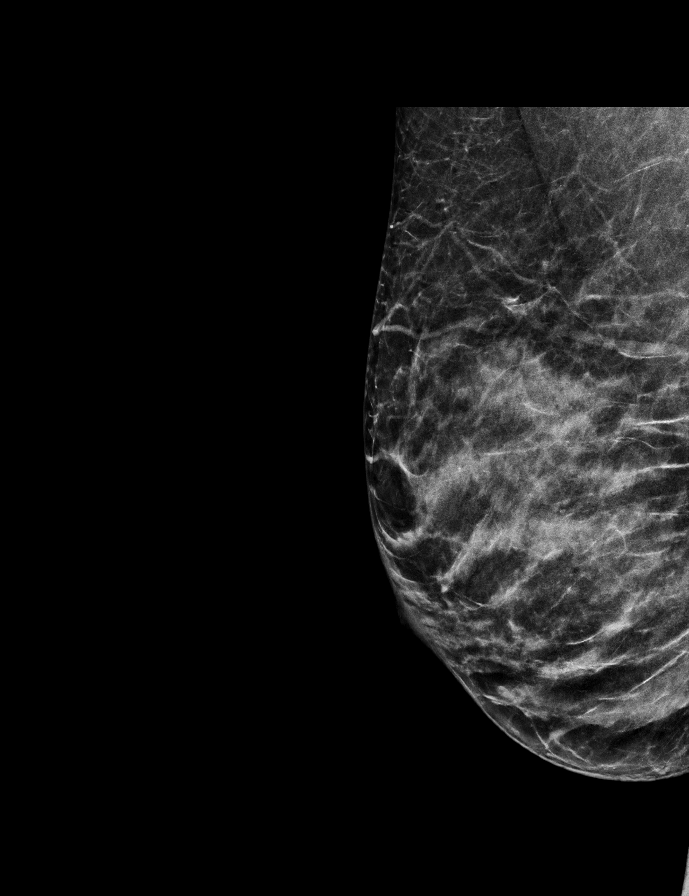

[R CC synth-2D (1 of 3)]
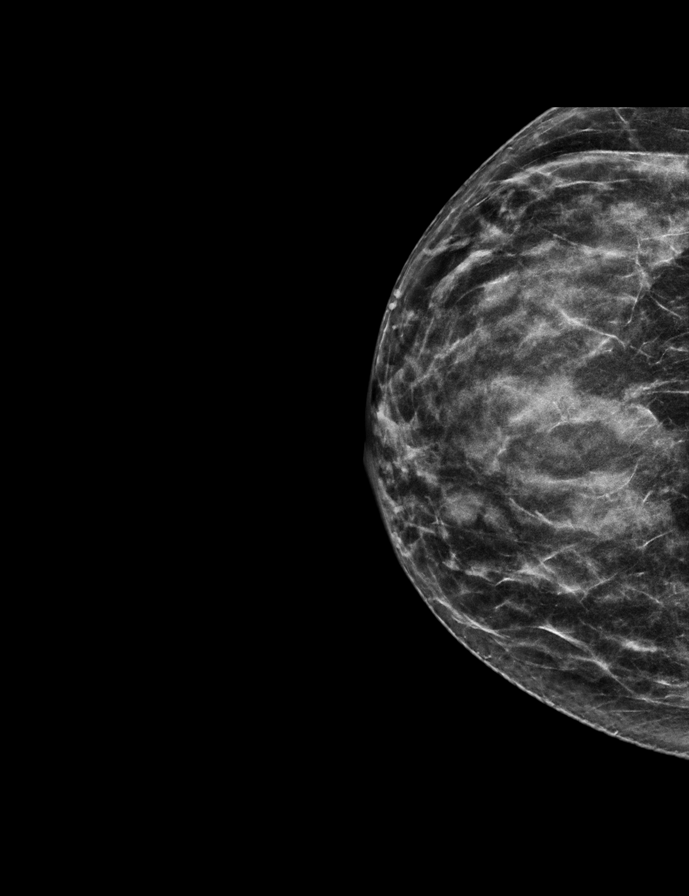

[L CC synth-2D]
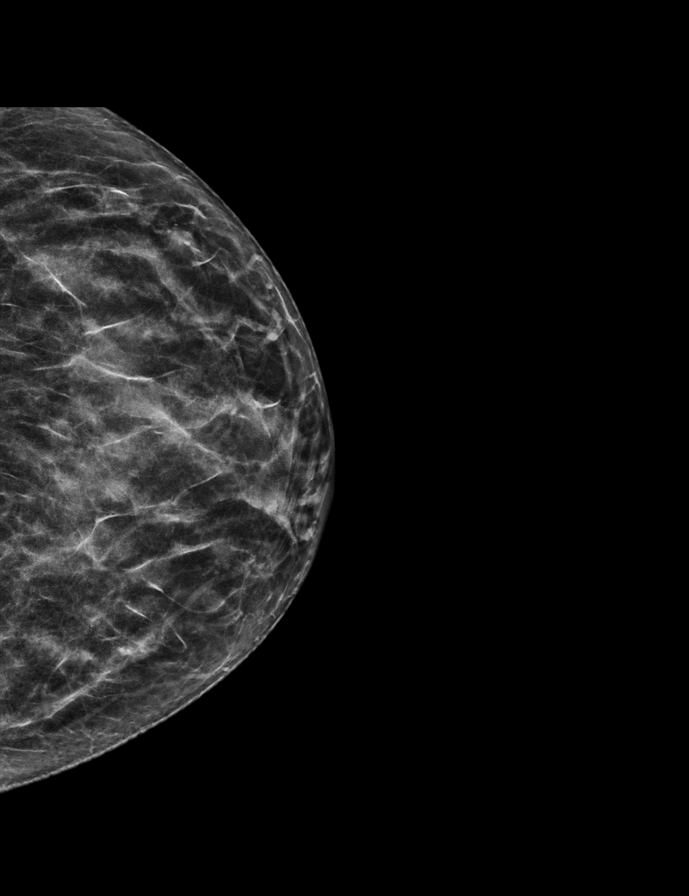

[R CC synth-2D (2 of 3)]
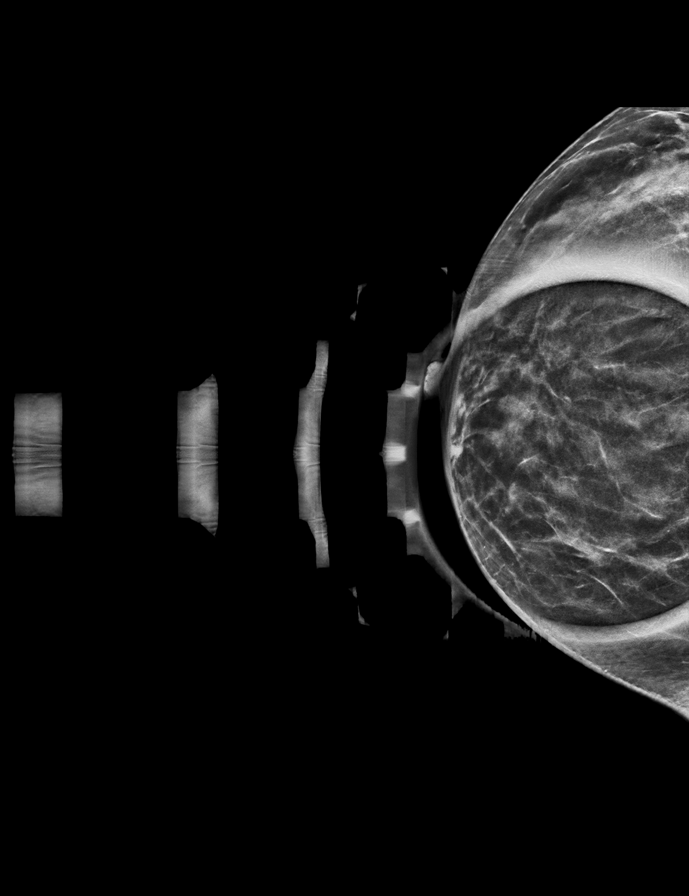

[L MLO synth-2D]
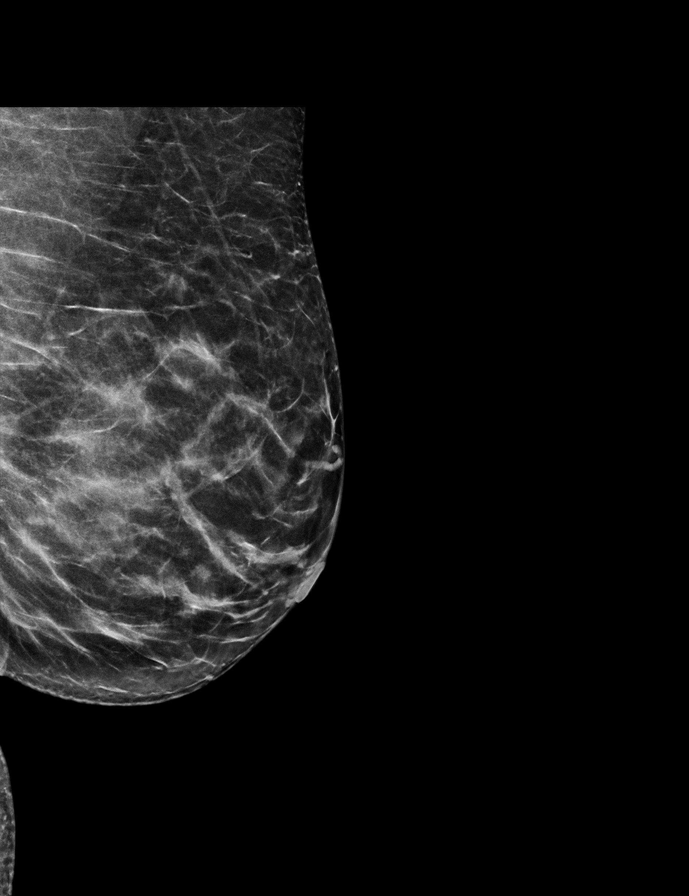

[R CC synth-2D (3 of 3)]
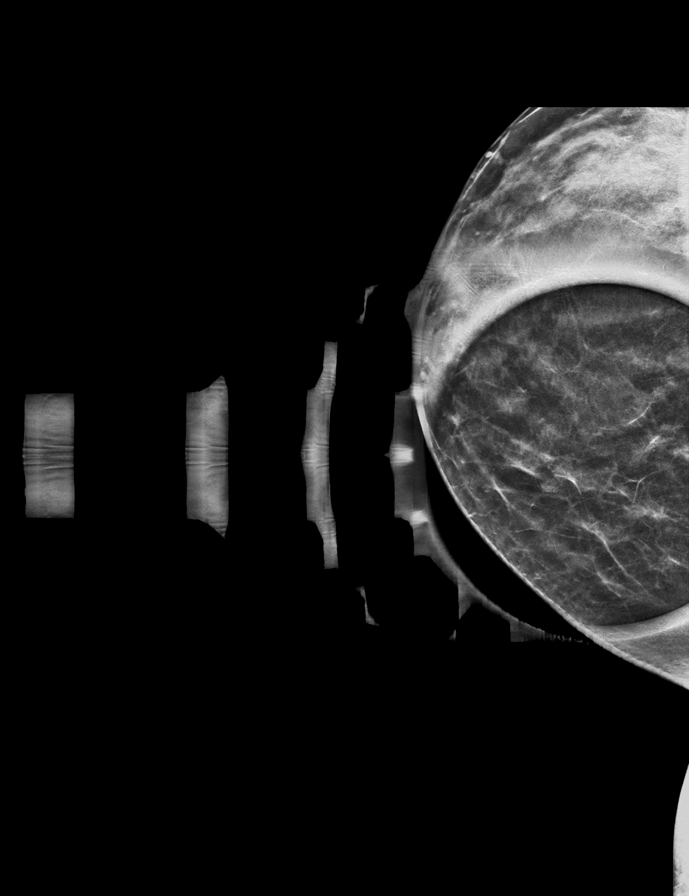

[R MLO tomo · tomo slice 28/55.0]
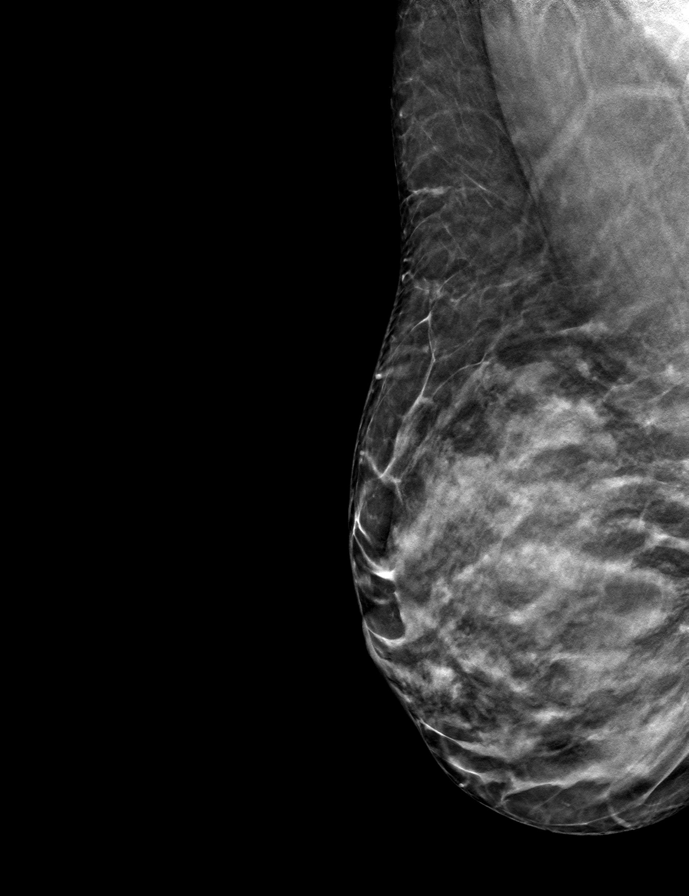

[8 of 40 positions shown; findings below may reference images not displayed]

ACR Breast Density Category c: The breast tissue is heterogeneously
dense, which may obscure small masses.
FINDINGS: Mammogram:

Right breast: A spot compression tomosynthesis cc and full field mL
tomosynthesis views of the right breast were performed in addition
to standard views for a questioned small oval circumscribed mass in
the medial retroareolar right breast measuring approximately 0.7 cm.
This is not definitely persist on the spot imaging. There are no new
suspicious findings elsewhere in the right breast.

Left breast: There is a stable small oval circumscribed mass in the
medial left breast anterior depth. There are no new suspicious
findings elsewhere in the left breast.

Ultrasound:

Right breast: Targeted ultrasound performed in the right breast at
12:30 o'clock 6 cm from the nipple demonstrating a cluster of cysts
measuring 0.6 x 0.3 x 0.5 cm, previously measuring 0.6 x 0.3 x
cm. At 9 o'clock 1 cm from the nipple there is an oval circumscribed
hypoechoic mass measuring 0.8 x 0.2 x 0.5 cm, previously measuring
0.7 x 0.3 x 0.5 cm. At 1 o'clock 1 cm from the nipple there is an
oval circumscribed anechoic mass with mobile internal echoes
measuring 0.6 x 0.3 x 0.4 cm, consistent with a benign cyst. This
likely corresponds to the small mass identified mammographically.

Left breast: At 9 o'clock 1 cm from nipple there is an oval
circumscribed hypoechoic mass measuring 0.7 x 0.4 x 0.6 cm,
previously measuring 0.6 x 0.3 x 0.6 cm.
IMPRESSION: 1. Stable masses in the right breast at 12:30 o'clock and 9 o'clock
as well as in the left breast at 9 o'clock. Given stability for over
2 years these are considered benign.

2.  Benign subcentimeter cyst in the right breast at 1 o'clock.

RECOMMENDATION:
Screening mammogram in one year.(Code:[id number])

I have discussed the findings and recommendations with the patient.
If applicable, a reminder letter will be sent to the patient
regarding the next appointment.

BI-RADS CATEGORY  2: Benign.

## 2022-08-19 ENCOUNTER — Ambulatory Visit (HOSPITAL_COMMUNITY)
Admission: EM | Admit: 2022-08-19 | Discharge: 2022-08-19 | Disposition: A | Discharge status: Home / Self Care | Payer: 59 | Attending: Emergency Medicine | Admitting: Emergency Medicine

## 2022-08-19 ENCOUNTER — Encounter (HOSPITAL_COMMUNITY): Payer: Self-pay | Admitting: *Deleted

## 2022-08-19 DIAGNOSIS — M7918 Myalgia, other site: Secondary | ICD-10-CM

## 2022-08-19 MED ORDER — CYCLOBENZAPRINE HCL 5 MG PO TABS: 5.0000 mg | ORAL_TABLET | Freq: Three times a day (TID) | ORAL | 0 refills | Status: AC | PRN

## 2022-08-19 MED ORDER — KETOROLAC TROMETHAMINE 30 MG/ML IJ SOLN
INTRAMUSCULAR | Status: AC
  Filled 2022-08-19: qty 1

## 2022-08-19 MED ORDER — KETOROLAC TROMETHAMINE 30 MG/ML IJ SOLN
30.0000 mg | Freq: Once | INTRAMUSCULAR | Status: AC
  Administered 2022-08-19: 30 mg via INTRAMUSCULAR

## 2022-08-19 MED ORDER — IBUPROFEN 800 MG PO TABS: 800.0000 mg | ORAL_TABLET | Freq: Three times a day (TID) | ORAL | 0 refills | Status: AC
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
## 2022-08-19 NOTE — Discharge Instructions (Signed)
Also try soaking in a hot bath with lots of epsom salt in it to help relieve your muscle pain.

## 2022-12-07 ENCOUNTER — Emergency Department (HOSPITAL_BASED_OUTPATIENT_CLINIC_OR_DEPARTMENT_OTHER)
Admission: EM | Admit: 2022-12-07 | Discharge: 2022-12-07 | Disposition: A | Discharge status: Home / Self Care | Payer: BLUE CROSS/BLUE SHIELD | Attending: Emergency Medicine | Admitting: Emergency Medicine

## 2022-12-07 ENCOUNTER — Encounter (HOSPITAL_BASED_OUTPATIENT_CLINIC_OR_DEPARTMENT_OTHER): Payer: Self-pay

## 2022-12-07 ENCOUNTER — Emergency Department (HOSPITAL_BASED_OUTPATIENT_CLINIC_OR_DEPARTMENT_OTHER): Payer: BLUE CROSS/BLUE SHIELD | Admitting: Radiology

## 2022-12-07 DIAGNOSIS — Y92410 Unspecified street and highway as the place of occurrence of the external cause: Secondary | ICD-10-CM | POA: Diagnosis not present

## 2022-12-07 DIAGNOSIS — M79642 Pain in left hand: Secondary | ICD-10-CM | POA: Insufficient documentation

## 2022-12-07 DIAGNOSIS — M25561 Pain in right knee: Secondary | ICD-10-CM | POA: Diagnosis not present

## 2022-12-07 DIAGNOSIS — F17210 Nicotine dependence, cigarettes, uncomplicated: Secondary | ICD-10-CM | POA: Insufficient documentation
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
## 2022-12-07 NOTE — Discharge Instructions (Addendum)
Note the workup today was overall reassuring.  X-rays were negative for any acute fracture or dislocation.  As discussed, recommend ice, rest, elevation as well as medical therapy in the form of ibuprofen/Motrin.  Attached is a work note.  Please do not hesitate to return to emergency department for worrisome signs and symptoms we discussed become apparent.
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
## 2023-09-22 ENCOUNTER — Encounter (HOSPITAL_BASED_OUTPATIENT_CLINIC_OR_DEPARTMENT_OTHER): Payer: Self-pay | Admitting: Emergency Medicine

## 2023-09-22 ENCOUNTER — Emergency Department (HOSPITAL_BASED_OUTPATIENT_CLINIC_OR_DEPARTMENT_OTHER): Payer: BLUE CROSS/BLUE SHIELD | Admitting: Radiology

## 2023-09-22 DIAGNOSIS — F17210 Nicotine dependence, cigarettes, uncomplicated: Secondary | ICD-10-CM | POA: Diagnosis not present

## 2023-09-22 DIAGNOSIS — R059 Cough, unspecified: Secondary | ICD-10-CM | POA: Diagnosis present

## 2023-09-22 DIAGNOSIS — J40 Bronchitis, not specified as acute or chronic: Secondary | ICD-10-CM | POA: Diagnosis not present

## 2023-09-22 DIAGNOSIS — Z20822 Contact with and (suspected) exposure to covid-19: Secondary | ICD-10-CM | POA: Insufficient documentation

## 2023-09-22 LAB — RESP PANEL BY RT-PCR (RSV, FLU A&B, COVID)  RVPGX2
Influenza A by PCR: NEGATIVE
Influenza B by PCR: NEGATIVE
Resp Syncytial Virus by PCR: NEGATIVE
SARS Coronavirus 2 by RT PCR: NEGATIVE

## 2023-09-22 NOTE — ED Triage Notes (Signed)
 Cough x 2 month, worse in last week, now coughing up mucus

## 2023-09-23 ENCOUNTER — Emergency Department (HOSPITAL_BASED_OUTPATIENT_CLINIC_OR_DEPARTMENT_OTHER)
Admission: EM | Admit: 2023-09-23 | Discharge: 2023-09-23 | Disposition: A | Discharge status: Home / Self Care | Payer: BLUE CROSS/BLUE SHIELD | Attending: Emergency Medicine | Admitting: Emergency Medicine

## 2023-09-23 DIAGNOSIS — J40 Bronchitis, not specified as acute or chronic: Secondary | ICD-10-CM

## 2023-09-23 MED ORDER — DOXYCYCLINE HYCLATE 100 MG PO TABS
100.0000 mg | ORAL_TABLET | Freq: Once | ORAL | Status: AC
  Administered 2023-09-23: 100 mg via ORAL
  Filled 2023-09-23: qty 1

## 2023-09-23 MED ORDER — PREDNISONE 50 MG PO TABS
60.0000 mg | ORAL_TABLET | Freq: Once | ORAL | Status: AC
Start: 2023-09-23 — End: 2023-09-23
  Administered 2023-09-23: 60 mg via ORAL
  Filled 2023-09-23: qty 1

## 2023-09-23 MED ORDER — DOXYCYCLINE HYCLATE 100 MG PO CAPS: 100.0000 mg | ORAL_CAPSULE | Freq: Two times a day (BID) | ORAL | 0 refills | Status: AC

## 2023-09-23 MED ORDER — PREDNISONE 20 MG PO TABS: 40.0000 mg | ORAL_TABLET | Freq: Every day | ORAL | 0 refills | Status: AC

## 2023-09-23 NOTE — Discharge Instructions (Signed)
 You were evaluated in the Emergency Department and after careful evaluation, we did not find any emergent condition requiring admission or further testing in the hospital.  Your exam/testing today is overall reassuring.  Symptoms likely due to bronchitis.  Recommend use of the steroids and doxycycline  antibiotics.  Please return to the Emergency Department if you experience any worsening of your condition.   Thank you for allowing us  to be a part of your care.

## 2023-09-23 NOTE — ED Notes (Signed)
 Initial contact made. Pt is resting in bed. Pt states she's had a cough for the last 2 months but has become wheezy with a productive cough over the last 2-3 days

## 2023-09-23 NOTE — ED Provider Notes (Signed)
 [facility] Emergency Department Provider Note MRN:  [id number]  Arrival date & time: 09/23/23     Chief Complaint   Cough   History of Present Illness   Selena Smith is a 48 y.o. year-old female with no pertinent past medical history presenting to the ED with chief complaint of cough.  Persistent cough for 2 months, not going away, over the past few days the cough has become more productive with yellow sputum.  No fever.  Feels like she is getting worse not better.  Review of Systems  A thorough review of systems was obtained and all systems are negative except as noted in the HPI and PMH.   Patient's Health History    Past Medical History:  Diagnosis Date   Medical history non-contributory     Past Surgical History:  Procedure Laterality Date   ABDOMINAL HYSTERECTOMY  10/17/2015   And Bilateral Salpingectomy. For fibroids, AUB.    Family History  Problem Relation Age of Onset   Hypertension Neg Hx    Diabetes Neg Hx    Cancer Neg Hx    Breast cancer Neg Hx     Social History   Socioeconomic History   Marital status: Single    Spouse name: Not on file   Number of children: Not on file   Years of education: Not on file   Highest education level: Not on file  Occupational History   Not on file  Tobacco Use   Smoking status: Some Days    Current packs/day: 0.25    Types: Cigarettes   Smokeless tobacco: Never  Vaping Use   Vaping status: Never Used  Substance and Sexual Activity   Alcohol use: No   Drug use: No   Sexual activity: Yes    Birth control/protection: None  Other Topics Concern   Not on file  Social History Narrative   Not on file   Social Drivers of Health   Financial Resource Strain: Not on file  Food Insecurity: Not on file  Transportation Needs: Not on file  Physical Activity: Not on file  Stress: Not on file  Social Connections: Not on file  Intimate Partner Violence: Not on file     Physical Exam    Vitals:   09/22/23 1922 09/22/23 2342  BP: (!) 176/98 (!) 140/112  Pulse: 71 84  Resp: 20 16  Temp: (!) 97.5 F (36.4 C) 98.2 F (36.8 C)  SpO2: 99% 93%    CONSTITUTIONAL: Well-appearing, NAD NEURO/PSYCH:  Alert and oriented x 3, no focal deficits EYES:  eyes equal and reactive ENT/NECK:  no LAD, no JVD CARDIO: Regular rate, well-perfused, normal S1 and S2 PULM:  CTAB no wheezing or rhonchi GI/GU:  non-distended, non-tender MSK/SPINE:  No gross deformities, no edema SKIN:  no rash, atraumatic   *Additional and/or pertinent findings included in MDM below  Diagnostic and Interventional Summary    EKG Interpretation Date/Time:    Ventricular Rate:    PR Interval:    QRS Duration:    QT Interval:    QTC Calculation:   R Axis:      Text Interpretation:         Labs Reviewed  RESP PANEL BY RT-PCR (RSV, FLU A&B, COVID)  [id number]    DG Chest 2 View  Final Result      Medications  predniSONE  (DELTASONE ) tablet 60 mg (has no administration in time range)  doxycycline  (VIBRA -TABS) tablet 100 mg (has no administration in  time range)     Procedures  /  Critical Care Procedures  ED Course and Medical Decision Making  Initial Impression and Ddx Suspicious for bronchitis versus secondary bacterial pneumonia.  Well-appearing in no acute distress with no tachycardia, no hypoxia, no leg pain or swelling, highly doubt PE or other emergent cardiopulmonary process.  Past medical/surgical history that increases complexity of ED encounter: None  Interpretation of Diagnostics I personally reviewed the Chest Xray and my interpretation is as follows: No pneumothorax, no lobar opacity    Patient Reassessment and Ultimate Disposition/Management     Given patient's chronicity of cough and worsening with new productivity there is suspicion for occult pneumonia, appropriate for discharge with treatment.  Patient management required discussion with the following services or  consulting groups:  None  Complexity of Problems Addressed Acute illness or injury that poses threat of life of bodily function  Additional Data Reviewed and Analyzed Further history obtained from: None  Additional Factors Impacting ED Encounter Risk Prescriptions  Ozell HERO. Theadore, MD [facility] Emergency Medicine [facility] [email or web address]  Final Clinical Impressions(s) / ED Diagnoses     ICD-10-CM   1. Bronchitis  J40       ED Discharge Orders          Ordered    doxycycline  (VIBRAMYCIN ) 100 MG capsule  2 times daily        09/23/23 0135    predniSONE  (DELTASONE ) 20 MG tablet  Daily        09/23/23 0135             Discharge Instructions Discussed with and Provided to Patient:    Discharge Instructions      You were evaluated in the Emergency Department and after careful evaluation, we did not find any emergent condition requiring admission or further testing in the hospital.  Your exam/testing today is overall reassuring.  Symptoms likely due to bronchitis.  Recommend use of the steroids and doxycycline  antibiotics.  Please return to the Emergency Department if you experience any worsening of your condition.   Thank you for allowing us  to be a part of your care.      Theadore Ozell HERO, MD 09/23/23 [phone]

## 2024-03-09 ENCOUNTER — Encounter (HOSPITAL_BASED_OUTPATIENT_CLINIC_OR_DEPARTMENT_OTHER): Payer: Self-pay

## 2024-03-09 ENCOUNTER — Emergency Department (HOSPITAL_BASED_OUTPATIENT_CLINIC_OR_DEPARTMENT_OTHER)
Admission: EM | Admit: 2024-03-09 | Discharge: 2024-03-09 | Disposition: A | Discharge status: Home / Self Care | Attending: Emergency Medicine | Admitting: Emergency Medicine

## 2024-03-09 ENCOUNTER — Emergency Department (HOSPITAL_BASED_OUTPATIENT_CLINIC_OR_DEPARTMENT_OTHER): Admitting: Radiology

## 2024-03-09 ENCOUNTER — Other Ambulatory Visit: Payer: Self-pay

## 2024-03-09 DIAGNOSIS — J40 Bronchitis, not specified as acute or chronic: Secondary | ICD-10-CM | POA: Insufficient documentation

## 2024-03-09 DIAGNOSIS — F17200 Nicotine dependence, unspecified, uncomplicated: Secondary | ICD-10-CM | POA: Insufficient documentation

## 2024-03-09 DIAGNOSIS — R059 Cough, unspecified: Secondary | ICD-10-CM | POA: Diagnosis present

## 2024-03-09 LAB — BASIC METABOLIC PANEL WITH GFR
Anion gap: 12 (ref 5–15)
BUN: 12 mg/dL (ref 6–20)
CO2: 26 mmol/L (ref 22–32)
Calcium: 9.1 mg/dL (ref 8.9–10.3)
Chloride: 105 mmol/L (ref 98–111)
Creatinine, Ser: 0.84 mg/dL (ref 0.44–1.00)
GFR, Estimated: >60 mL/min (ref >60)
Glucose, Bld: 88 mg/dL (ref 70–99)
Potassium: 3.4 mmol/L — ABNORMAL LOW (ref 3.5–5.1)
Sodium: 142 mmol/L (ref 135–145)

## 2024-03-09 LAB — CBC WITH DIFFERENTIAL/PLATELET
Abs Immature Granulocytes: 0.01 10*3/uL (ref 0.00–0.07)
Basophils Absolute: 0 10*3/uL (ref 0.0–0.1)
Basophils Relative: 0 %
Eosinophils Absolute: 0.4 10*3/uL (ref 0.0–0.5)
Eosinophils Relative: 7 %
HCT: 40.5 % (ref 36.0–46.0)
Hemoglobin: 13.1 g/dL (ref 12.0–15.0)
Immature Granulocytes: 0 %
Lymphocytes Relative: 26 %
Lymphs Abs: 1.5 10*3/uL (ref 0.7–4.0)
MCH: 29 pg (ref 26.0–34.0)
MCHC: 32.3 g/dL (ref 30.0–36.0)
MCV: 89.6 fL (ref 80.0–100.0)
Monocytes Absolute: 0.6 10*3/uL (ref 0.1–1.0)
Monocytes Relative: 11 %
Neutro Abs: 3.3 10*3/uL (ref 1.7–7.7)
Neutrophils Relative %: 56 %
Platelets: 255 10*3/uL (ref 150–400)
RBC: 4.52 MIL/uL (ref 3.87–5.11)
RDW: 14 % (ref 11.5–15.5)
WBC: 5.9 10*3/uL (ref 4.0–10.5)
nRBC: 0 % (ref 0.0–0.2)

## 2024-03-09 MED ORDER — PREDNISONE 50 MG PO TABS
60.0000 mg | ORAL_TABLET | Freq: Once | ORAL | Status: AC
Start: 2024-03-09 — End: 2024-03-09
  Administered 2024-03-09: 60 mg via ORAL
  Filled 2024-03-09: qty 1

## 2024-03-09 MED ORDER — IPRATROPIUM-ALBUTEROL 0.5-2.5 (3) MG/3ML IN SOLN
3.0000 mL | Freq: Once | RESPIRATORY_TRACT | Status: AC
  Administered 2024-03-09: 3 mL via RESPIRATORY_TRACT
  Filled 2024-03-09: qty 3

## 2024-03-09 MED ORDER — PREDNISONE 10 MG (21) PO TBPK: ORAL_TABLET | ORAL | 0 refills | Status: AC

## 2024-03-09 MED ORDER — AEROCHAMBER PLUS FLO-VU MISC
1.0000 | Freq: Once | Status: AC
  Administered 2024-03-09: 1
  Filled 2024-03-09: qty 1

## 2024-03-09 MED ORDER — ALBUTEROL SULFATE HFA 108 (90 BASE) MCG/ACT IN AERS
2.0000 | INHALATION_SPRAY | RESPIRATORY_TRACT | Status: DC | PRN
  Administered 2024-03-09: 2 via RESPIRATORY_TRACT
  Filled 2024-03-09: qty 6.7

## 2024-03-09 NOTE — ED Triage Notes (Signed)
 Pt reports she is here today due to cough, nasal congestion. Pt reports productive cough. Pt reports wheezing at home. Pt states she has never been formally dx with asthma but believes she is having a flare up.

## 2024-03-09 NOTE — ED Provider Notes (Signed)
 Selena Smith  Provider Note  CSN: [id number] Arrival date & time: 03/09/24 0503  History Chief Complaint  Patient presents with   Cough    Selena Smith is a 48 y.o. female with no significant PMH reports 2 days of mostly dry, occasionally productive cough and SOB. No fever. She is a smoker. Had a bronchitis earlier this year that had improved.    Home Medications Prior to Admission medications   Medication Sig Start Date End Date Taking? Authorizing Provider  predniSONE  (STERAPRED UNI-PAK 21 TAB) 10 MG (21) TBPK tablet 10mg  Tabs, 6 day taper. Use as directed 03/09/24  Yes Roselyn Carlin NOVAK, MD  cyclobenzaprine  (FLEXERIL ) 5 MG tablet Take 1 tablet (5 mg total) by mouth 3 (three) times daily as needed for muscle spasms. 08/19/22   Richad Jon HERO, NP  ibuprofen  (ADVIL ) 800 MG tablet Take 1 tablet (800 mg total) by mouth 3 (three) times daily. 08/19/22   Richad Jon HERO, NP     Allergies    Patient has no known allergies.   Review of Systems   Review of Systems Please see HPI for pertinent positives and negatives  Physical Exam BP 126/75   Pulse 89   Temp 98.2 F (36.8 C) (Oral)   Resp 18   LMP 09/24/2015 (Approximate)   SpO2 99%   Physical Exam Vitals and nursing note reviewed.  Constitutional:      Appearance: Normal appearance.  HENT:     Head: Normocephalic and atraumatic.     Nose: Nose normal.     Mouth/Throat:     Mouth: Mucous membranes are moist.   Eyes:     Extraocular Movements: Extraocular movements intact.     Conjunctiva/sclera: Conjunctivae normal.    Cardiovascular:     Rate and Rhythm: Normal rate.  Pulmonary:     Effort: Pulmonary effort is normal. No respiratory distress.     Breath sounds: Wheezing present. No rales.  Abdominal:     General: Abdomen is flat.     Palpations: Abdomen is soft.     Tenderness: There is no abdominal tenderness.   Musculoskeletal:        General: No swelling.  Normal range of motion.     Cervical back: Neck supple.     Right lower leg: No edema.     Left lower leg: No edema.   Skin:    General: Skin is warm and dry.   Neurological:     General: No focal deficit present.     Mental Status: She is alert.   Psychiatric:        Mood and Affect: Mood normal.     ED Results / Procedures / Treatments   EKG None  Procedures Procedures  Medications Ordered in the ED Medications  albuterol (VENTOLIN HFA) 108 (90 Base) MCG/ACT inhaler 2 puff (has no administration in time range)  Aerochamber Plus device 1 each (has no administration in time range)  ipratropium-albuterol (DUONEB) 0.5-2.5 (3) MG/3ML nebulizer solution 3 mL (3 mLs Nebulization Given 03/09/24 0521)  predniSONE  (DELTASONE ) tablet 60 mg (60 mg Oral Given 03/09/24 0528)  ipratropium-albuterol (DUONEB) 0.5-2.5 (3) MG/3ML nebulizer solution 3 mL (3 mLs Nebulization Given 03/09/24 0556)  ipratropium-albuterol (DUONEB) 0.5-2.5 (3) MG/3ML nebulizer solution 3 mL (3 mLs Nebulization Given 03/09/24 0622)    Initial Impression and Plan  Patient here with cough and wheezing, likely a viral bronchitis. Otherwise vitals and exam are reassuring. Will check CXR.  Give neb and steroids and reassess.   ED Course   Clinical Course as of 03/09/24 0653  Tue Mar 09, 2024  0557 I personally viewed the images from radiology studies and agree with radiologist interpretation: CXR neg for acute process. There is some evidence of COPD. Still wheezing after first Duoneb, will give a second.  [CS]  [phone] Patient still having some wheezing after second neb. Will give a third, add basic labs in the event she requires admission.  [CS]  0643 CBC is normal.  [CS]  0647 BMP is unremarkable.  [CS]  [id number] Patient feeling better, SpO2 remains normal. Resting comfortably in no distress. Plan discharge with albuterol inhaler, steroid taper. Recommend she stop smoking. PCP follow up, RTED for any other concerns.   [CS]     Clinical Course User Index [CS] Roselyn Carlin NOVAK, MD     MDM Rules/Calculators/A&P Medical Decision Making Given presenting complaint, I considered that admission might be necessary. After review of results from ED lab and/or imaging studies, admission to the hospital is not indicated at this time.    Problems Addressed: Bronchitis: acute illness or injury  Amount and/or Complexity of Data Reviewed Labs: ordered. Decision-making details documented in ED Course. Radiology: ordered and independent interpretation performed. Decision-making details documented in ED Course.  Risk Prescription drug management. Decision regarding hospitalization.     Final Clinical Impression(s) / ED Diagnoses Final diagnoses:  Bronchitis    Rx / DC Orders ED Discharge Orders          Ordered    predniSONE  (STERAPRED UNI-PAK 21 TAB) 10 MG (21) TBPK tablet        03/09/24 9346             Roselyn Carlin NOVAK, MD 03/09/24 [phone]

## 2024-03-09 NOTE — ED Notes (Signed)
 Patient provided with inhaler and spacer. Indications of use discussed with patient. Correct spacer usage demonstrated with patient. Patient able to perform independently.

## 2024-09-02 ENCOUNTER — Other Ambulatory Visit: Payer: Self-pay

## 2024-09-02 ENCOUNTER — Emergency Department (HOSPITAL_BASED_OUTPATIENT_CLINIC_OR_DEPARTMENT_OTHER)
Admission: EM | Admit: 2024-09-02 | Discharge: 2024-09-02 | Disposition: A | Discharge status: Home / Self Care | Payer: Self-pay | Attending: Emergency Medicine | Admitting: Emergency Medicine

## 2024-09-02 ENCOUNTER — Encounter (HOSPITAL_BASED_OUTPATIENT_CLINIC_OR_DEPARTMENT_OTHER): Payer: Self-pay | Admitting: Emergency Medicine

## 2024-09-02 DIAGNOSIS — H44822 Luxation of globe, left eye: Secondary | ICD-10-CM | POA: Insufficient documentation

## 2024-09-02 MED ORDER — FLUORESCEIN SODIUM 1 MG OP STRP
1.0000 | ORAL_STRIP | Freq: Once | OPHTHALMIC | Status: AC
  Administered 2024-09-02: 12:00:00 1 via OPHTHALMIC
  Filled 2024-09-02: qty 1

## 2024-09-02 MED ORDER — TETRACAINE HCL 0.5 % OP SOLN
2.0000 [drp] | Freq: Once | OPHTHALMIC | Status: AC
  Administered 2024-09-02: 12:00:00 2 [drp] via OPHTHALMIC
  Filled 2024-09-02: qty 4

## 2024-09-02 NOTE — ED Notes (Signed)
 Discharge instructions to go to ophthalmology upon leaving reviewed and explained, pt verbalized understanding with no further questions on d/c.

## 2024-09-02 NOTE — ED Triage Notes (Signed)
 Pt caox4 ambulatory NAD reporting she was washing her face under her eyes this morning and states her L eyeball popped out of the socket and she had to push it back in. Pain and swelling since.

## 2024-09-02 NOTE — ED Provider Notes (Signed)
 Selena Smith   CSN: [id number] Arrival date & time: 09/02/24  9079     Patient presents with: Eye Problem   Selena Smith is a 48 y.o. female.   Patient presents to the emergency department for evaluation of left eye injury.  Patient states that she was washing her face, pressing below the left eye this morning.  She states that her eye spontaneously came out of the socket and she was able to put firm pressure over the eye to push it back in.  Patient denies loss of vision or blurry vision.  She has 7 out of 10 pain around the orbit.  No range of motion difficulties.  No double vision.  She has noted mild edema of the left upper eyelid laterally that has been present for more than a year.  She denies previous surgeries.  She states that this has occurred twice before in her life but has never had it checked.  She said it occurred when she was 48 years old and then again a few months ago.       Prior to Admission medications  Medication Sig Start Date End Date Taking? Authorizing Provider  cyclobenzaprine  (FLEXERIL ) 5 MG tablet Take 1 tablet (5 mg total) by mouth 3 (three) times daily as needed for muscle spasms. 08/19/22   Richad Jon HERO, NP  ibuprofen  (ADVIL ) 800 MG tablet Take 1 tablet (800 mg total) by mouth 3 (three) times daily. 08/19/22   Richad Jon HERO, NP  predniSONE  (STERAPRED UNI-PAK 21 TAB) 10 MG (21) TBPK tablet 10mg  Tabs, 6 day taper. Use as directed 03/09/24   Roselyn Carlin NOVAK, MD    Allergies: Patient has no known allergies.    Review of Systems  Updated Vital Signs BP (!) 126/90   Pulse 84   Temp 98.8 F (37.1 C)   Resp 12   Ht 5' 4 (1.626 m)   Wt 68 kg   LMP 09/24/2015   SpO2 98%   BMI 25.75 kg/m   Physical Exam Vitals and nursing Smith reviewed.  Constitutional:      Appearance: She is well-developed.  HENT:     Head: Normocephalic and atraumatic.  Eyes:     General: Vision grossly  intact.     Intraocular pressure: Right eye pressure is 14 mmHg. Left eye pressure is 21 mmHg. Measurements were taken using a handheld tonometer.    Extraocular Movements: Extraocular movements intact.     Right eye: Normal extraocular motion.     Left eye: Normal extraocular motion.     Conjunctiva/sclera: Conjunctivae normal.     Right eye: Right conjunctiva is not injected. No chemosis, exudate or hemorrhage.    Left eye: Left conjunctiva is not injected. No chemosis, exudate or hemorrhage.    Pupils: Pupils are equal, round, and reactive to light.     Right eye: Pupil is round, reactive and not sluggish. No corneal abrasion or fluorescein  uptake.     Left eye: Pupil is round, reactive and not sluggish. No corneal abrasion or fluorescein  uptake.      Comments: Mild edema compared to the right of the left upper lateral eyelid  Pulmonary:     Effort: No respiratory distress.  Musculoskeletal:     Cervical back: Normal range of motion and neck supple.  Skin:    General: Skin is warm and dry.  Neurological:     Mental Status: She is alert.     (  all labs ordered are listed, but only abnormal results are displayed) Labs Reviewed - No data to display  EKG: None  Radiology: No results found.   Procedures   Medications Ordered in the ED  tetracaine  (PONTOCAINE) 0.5 % ophthalmic solution 2 drop (has no administration in time range)  fluorescein  ophthalmic strip 1 strip (has no administration in time range)   ED Course  Patient seen and examined. History obtained directly from patient.   Labs/EKG: None ordered  Imaging: None ordered  Medications/Fluids: Ordered: Tetracaine , fluorescein   Most recent vital signs reviewed and are as follows: BP (!) 126/90   Pulse 84   Temp 98.8 F (37.1 C)   Resp 12   Ht 5' 4 (1.626 m)   Wt 68 kg   LMP 09/24/2015   SpO2 98%   BMI 25.75 kg/m   Initial impression: Patient describing a transient global luxation of the left eye.   Currently normal range of motion and visual acuity.  Will check pressure.  Discussed case with Dr. Armenta.   12:35 PM eye exam performed.  No significant findings, pressures are normal.  Will request recommendations from ophthalmology.    Visual Acuity  Right Eye Distance: (S) 20/20 Left Eye Distance: (S) 20/20 Bilateral Distance: (S) 20/20  Right Eye Near:   Left Eye Near:    Bilateral Near:     1:43 PM case discussed with on-call ophthalmologist, Dr. Waylan.  They are happy to see the patient and work into their office schedule today.  Asked that patient be discharged and come to the office.  Patient updated, agrees with plan.                                   Medical Decision Making Risk Prescription drug management.   Patient presents with transient left globe luxation.  She has essentially normal exam other than some soreness in the left eye and face area.  Discussed with on-call ophthalmology and patient will be seen in office today.  Pressures are normal.  Normal cornea exam.  Normal visual acuity.     Final diagnoses:  Luxation, globe, left    ED Discharge Orders     None          Desiderio Chew, PA-C 09/02/24 1344

## 2024-09-02 NOTE — Discharge Instructions (Addendum)
 Please go to the ophthalmologist office as directed.  You are being worked into their schedule, may have a little bit of a wait.  If you are not there by 4:00pm, you will need to be seen tomorrow.
